# Patient Record
Sex: Male | Born: 1960 | Race: White | Hispanic: No | Marital: Single | Smoking: Former smoker
Health system: Southern US, Community
[De-identification: ages and names within clinical notes are randomized; demographics above are authoritative.]

## PROBLEM LIST (undated history)

## (undated) DIAGNOSIS — R7303 Prediabetes: Secondary | ICD-10-CM

## (undated) DIAGNOSIS — I639 Cerebral infarction, unspecified: Secondary | ICD-10-CM

## (undated) DIAGNOSIS — E039 Hypothyroidism, unspecified: Secondary | ICD-10-CM

## (undated) DIAGNOSIS — J302 Other seasonal allergic rhinitis: Secondary | ICD-10-CM

## (undated) DIAGNOSIS — G4730 Sleep apnea, unspecified: Secondary | ICD-10-CM

## (undated) DIAGNOSIS — I10 Essential (primary) hypertension: Secondary | ICD-10-CM

## (undated) DIAGNOSIS — Z87898 Personal history of other specified conditions: Secondary | ICD-10-CM

## (undated) DIAGNOSIS — R55 Syncope and collapse: Secondary | ICD-10-CM

## (undated) HISTORY — DX: Essential (primary) hypertension: I10

## (undated) HISTORY — DX: Other seasonal allergic rhinitis: J30.2

## (undated) HISTORY — DX: Personal history of other specified conditions: Z87.898

## (undated) HISTORY — DX: Syncope and collapse: R55

## (undated) HISTORY — DX: Cerebral infarction, unspecified: I63.9

## (undated) HISTORY — PX: NASAL SINUS SURGERY: SHX719

## (undated) HISTORY — PX: OTHER SURGICAL HISTORY: SHX169

---

## 2003-06-15 DIAGNOSIS — I639 Cerebral infarction, unspecified: Secondary | ICD-10-CM

## 2003-06-15 HISTORY — DX: Cerebral infarction, unspecified: I63.9

## 2005-02-24 ENCOUNTER — Emergency Department (HOSPITAL_COMMUNITY): Admission: EM | Admit: 2005-02-24 | Discharge: 2005-02-24 | Payer: Self-pay | Admitting: Emergency Medicine

## 2007-04-05 IMAGING — CR DG CHEST 1V PORT
1 series · 1 of 1 positions shown · non-contrast
Comparison: None available.

CLINICAL DATA: Dizziness and chest pain.  
 PORTABLE CHEST - 1 VIEW 02/24/05:

[view not recorded]
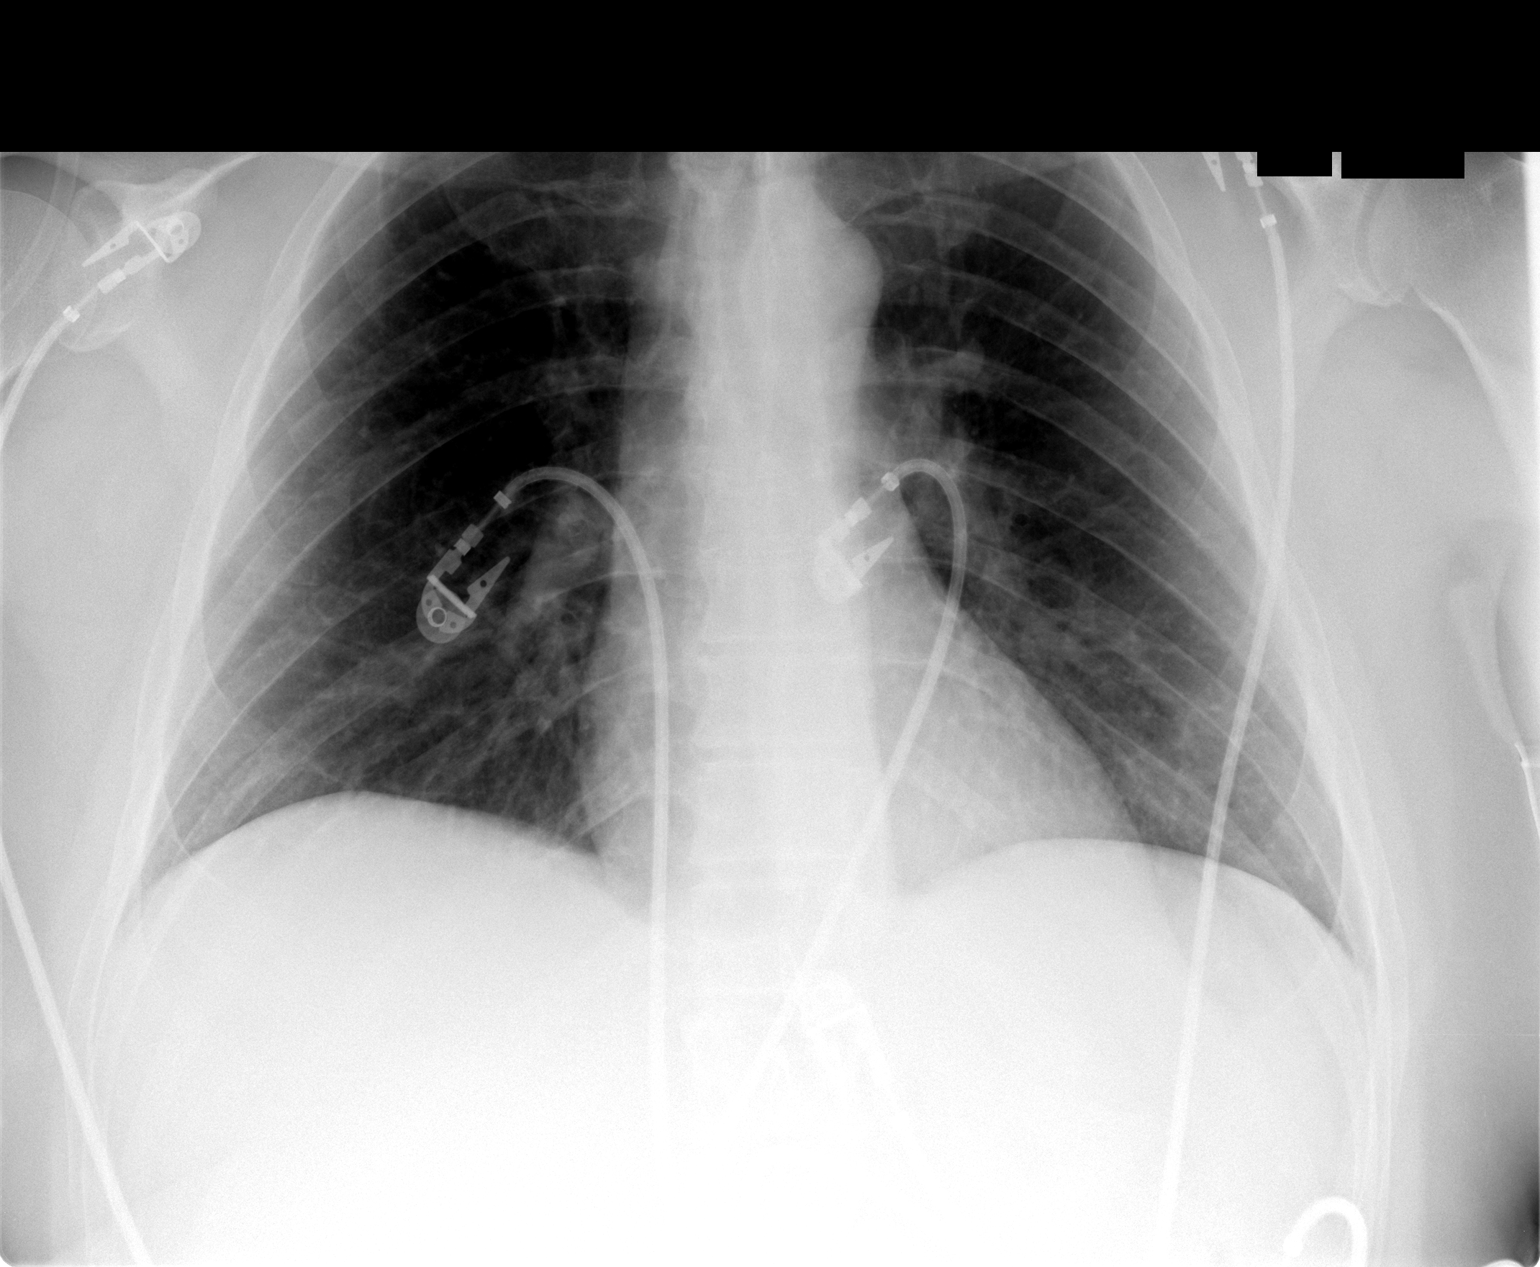

[1 of 1 positions shown; findings below may reference images not displayed]

FINDINGS: Trachea is midline.  Heart size within normal limits.  Lungs are clear.
IMPRESSION: No acute cardiopulmonary process.

## 2011-08-27 ENCOUNTER — Encounter: Payer: Self-pay | Admitting: *Deleted

## 2011-08-27 DIAGNOSIS — R55 Syncope and collapse: Secondary | ICD-10-CM | POA: Insufficient documentation

## 2011-08-27 DIAGNOSIS — Z87898 Personal history of other specified conditions: Secondary | ICD-10-CM | POA: Insufficient documentation

## 2011-08-27 DIAGNOSIS — I639 Cerebral infarction, unspecified: Secondary | ICD-10-CM | POA: Insufficient documentation

## 2011-08-27 DIAGNOSIS — J302 Other seasonal allergic rhinitis: Secondary | ICD-10-CM | POA: Insufficient documentation

## 2011-08-27 DIAGNOSIS — K5900 Constipation, unspecified: Secondary | ICD-10-CM | POA: Insufficient documentation

## 2011-11-17 ENCOUNTER — Encounter: Payer: Self-pay | Admitting: Cardiovascular Disease

## 2016-03-18 ENCOUNTER — Other Ambulatory Visit: Payer: Self-pay | Admitting: Orthopedic Surgery

## 2016-04-15 ENCOUNTER — Encounter (HOSPITAL_BASED_OUTPATIENT_CLINIC_OR_DEPARTMENT_OTHER): Payer: Self-pay | Admitting: *Deleted

## 2016-04-15 NOTE — Progress Notes (Signed)
Pt's pmh and cardiology notes reviewed with Dr Desmond Lopeurk MDA. Will get EKG dos. Ok [facility] 04/22/16.

## 2016-04-22 ENCOUNTER — Ambulatory Visit (HOSPITAL_BASED_OUTPATIENT_CLINIC_OR_DEPARTMENT_OTHER): Payer: Self-pay | Admitting: Anesthesiology

## 2016-04-22 ENCOUNTER — Encounter (HOSPITAL_BASED_OUTPATIENT_CLINIC_OR_DEPARTMENT_OTHER): Payer: Self-pay | Admitting: Anesthesiology

## 2016-04-22 ENCOUNTER — Encounter (HOSPITAL_BASED_OUTPATIENT_CLINIC_OR_DEPARTMENT_OTHER)
Admission: RE | Disposition: A | Discharge status: Home / Self Care | Payer: Self-pay | Source: Ambulatory Visit | Attending: Orthopedic Surgery

## 2016-04-22 ENCOUNTER — Ambulatory Visit (HOSPITAL_BASED_OUTPATIENT_CLINIC_OR_DEPARTMENT_OTHER)
Admission: RE | Admit: 2016-04-22 | Discharge: 2016-04-22 | Disposition: A | Discharge status: Home / Self Care | Payer: Self-pay | Source: Ambulatory Visit | Attending: Orthopedic Surgery | Admitting: Orthopedic Surgery

## 2016-04-22 DIAGNOSIS — Z8673 Personal history of transient ischemic attack (TIA), and cerebral infarction without residual deficits: Secondary | ICD-10-CM | POA: Insufficient documentation

## 2016-04-22 DIAGNOSIS — G4730 Sleep apnea, unspecified: Secondary | ICD-10-CM | POA: Insufficient documentation

## 2016-04-22 DIAGNOSIS — M180 Bilateral primary osteoarthritis of first carpometacarpal joints: Secondary | ICD-10-CM | POA: Insufficient documentation

## 2016-04-22 DIAGNOSIS — I10 Essential (primary) hypertension: Secondary | ICD-10-CM | POA: Insufficient documentation

## 2016-04-22 DIAGNOSIS — Q740 Other congenital malformations of upper limb(s), including shoulder girdle: Secondary | ICD-10-CM | POA: Insufficient documentation

## 2016-04-22 DIAGNOSIS — Z87891 Personal history of nicotine dependence: Secondary | ICD-10-CM | POA: Insufficient documentation

## 2016-04-22 DIAGNOSIS — E119 Type 2 diabetes mellitus without complications: Secondary | ICD-10-CM | POA: Insufficient documentation

## 2016-04-22 HISTORY — PX: EXCISION METACARPAL MASS: SHX6372

## 2016-04-22 HISTORY — DX: Prediabetes: R73.03

## 2016-04-22 HISTORY — PX: STERIOD INJECTION: SHX5046

## 2016-04-22 HISTORY — DX: Hypothyroidism, unspecified: E03.9

## 2016-04-22 HISTORY — PX: CARPOMETACARPEL SUSPENSION PLASTY: SHX5005

## 2016-04-22 HISTORY — DX: Sleep apnea, unspecified: G47.30

## 2016-04-22 LAB — POCT I-STAT, CHEM 8
BUN: 11 mg/dL (ref 6–20)
CHLORIDE: 104 mmol/L (ref 101–111)
Calcium, Ion: 1.19 mmol/L (ref 1.15–1.40)
Creatinine, Ser: 0.7 mg/dL (ref 0.61–1.24)
Glucose, Bld: 113 mg/dL — ABNORMAL HIGH (ref 65–99)
HEMATOCRIT: 48 % (ref 39.0–52.0)
Hemoglobin: 16.3 g/dL (ref 13.0–17.0)
Potassium: 3.6 mmol/L (ref 3.5–5.1)
SODIUM: 141 mmol/L (ref 135–145)
TCO2: 24 mmol/L (ref 0–100)

## 2016-04-22 SURGERY — CARPOMETACARPEL (CMC) SUSPENSION PLASTY
Anesthesia: Monitor Anesthesia Care | Site: Hand | Laterality: Right

## 2016-04-22 MED ORDER — BETAMETHASONE SOD PHOS & ACET 6 (3-3) MG/ML IJ SUSP
INTRAMUSCULAR | Status: DC | PRN
  Administered 2016-04-22: .5 mL via INTRA_ARTICULAR

## 2016-04-22 MED ORDER — LIDOCAINE HCL (CARDIAC) 20 MG/ML IV SOLN
INTRAVENOUS | Status: DC | PRN
  Administered 2016-04-22: 10 mg via INTRAVENOUS

## 2016-04-22 MED ORDER — PROMETHAZINE HCL 25 MG/ML IJ SOLN: 6.2500 mg | INTRAMUSCULAR | Status: DC | PRN

## 2016-04-22 MED ORDER — LIDOCAINE-EPINEPHRINE (PF) 2 %-1:200000 IJ SOLN
INTRAMUSCULAR | Status: DC | PRN
  Administered 2016-04-22: 10 mL via INTRADERMAL

## 2016-04-22 MED ORDER — DEXAMETHASONE SODIUM PHOSPHATE 10 MG/ML IJ SOLN
INTRAMUSCULAR | Status: AC
  Filled 2016-04-22: qty 1

## 2016-04-22 MED ORDER — FENTANYL CITRATE (PF) 100 MCG/2ML IJ SOLN
INTRAMUSCULAR | Status: AC
  Filled 2016-04-22: qty 2

## 2016-04-22 MED ORDER — SCOPOLAMINE 1 MG/3DAYS TD PT72: 1.0000 | MEDICATED_PATCH | Freq: Once | TRANSDERMAL | Status: DC | PRN

## 2016-04-22 MED ORDER — OXYCODONE HCL 5 MG PO TABS: 5.0000 mg | ORAL_TABLET | Freq: Once | ORAL | Status: DC | PRN

## 2016-04-22 MED ORDER — FENTANYL CITRATE (PF) 100 MCG/2ML IJ SOLN
INTRAMUSCULAR | Status: DC | PRN
  Administered 2016-04-22: 100 ug via INTRAVENOUS

## 2016-04-22 MED ORDER — MIDAZOLAM HCL 2 MG/2ML IJ SOLN
INTRAMUSCULAR | Status: AC
  Filled 2016-04-22: qty 2

## 2016-04-22 MED ORDER — MIDAZOLAM HCL 2 MG/2ML IJ SOLN
1.0000 mg | INTRAMUSCULAR | Status: DC | PRN
  Administered 2016-04-22: 2 mg via INTRAVENOUS

## 2016-04-22 MED ORDER — OXYCODONE HCL 5 MG/5ML PO SOLN: 5.0000 mg | Freq: Once | ORAL | Status: DC | PRN

## 2016-04-22 MED ORDER — CHLORHEXIDINE GLUCONATE 4 % EX LIQD: 60.0000 mL | Freq: Once | CUTANEOUS | Status: DC

## 2016-04-22 MED ORDER — BUPIVACAINE-EPINEPHRINE (PF) 0.5% -1:200000 IJ SOLN
INTRAMUSCULAR | Status: DC | PRN
  Administered 2016-04-22: 30 mL via PERINEURAL
  Administered 2016-04-22: 10 mL

## 2016-04-22 MED ORDER — MEPERIDINE HCL 25 MG/ML IJ SOLN: 6.2500 mg | INTRAMUSCULAR | Status: DC | PRN

## 2016-04-22 MED ORDER — BETAMETHASONE SOD PHOS & ACET 6 (3-3) MG/ML IJ SUSP
INTRAMUSCULAR | Status: AC
  Filled 2016-04-22: qty 1

## 2016-04-22 MED ORDER — CEFAZOLIN SODIUM-DEXTROSE 2-4 GM/100ML-% IV SOLN
2.0000 g | INTRAVENOUS | Status: AC
  Administered 2016-04-22: 2 g via INTRAVENOUS

## 2016-04-22 MED ORDER — CEFAZOLIN SODIUM-DEXTROSE 2-4 GM/100ML-% IV SOLN
INTRAVENOUS | Status: AC
  Filled 2016-04-22: qty 100

## 2016-04-22 MED ORDER — PROPOFOL 10 MG/ML IV BOLUS
INTRAVENOUS | Status: DC | PRN
  Administered 2016-04-22 (×4): 20 mg via INTRAVENOUS

## 2016-04-22 MED ORDER — ONDANSETRON HCL 4 MG/2ML IJ SOLN
INTRAMUSCULAR | Status: DC | PRN
  Administered 2016-04-22: 4 mg via INTRAVENOUS

## 2016-04-22 MED ORDER — MIDAZOLAM HCL 5 MG/5ML IJ SOLN
INTRAMUSCULAR | Status: DC | PRN
  Administered 2016-04-22: 2 mg via INTRAVENOUS

## 2016-04-22 MED ORDER — ONDANSETRON HCL 4 MG/2ML IJ SOLN
INTRAMUSCULAR | Status: AC
  Filled 2016-04-22: qty 2

## 2016-04-22 MED ORDER — LIDOCAINE 2% (20 MG/ML) 5 ML SYRINGE
INTRAMUSCULAR | Status: AC
  Filled 2016-04-22: qty 5

## 2016-04-22 MED ORDER — LACTATED RINGERS IV SOLN
INTRAVENOUS | Status: DC
  Administered 2016-04-22: 11:00:00 via INTRAVENOUS
  Administered 2016-04-22: 10 mL/h via INTRAVENOUS

## 2016-04-22 MED ORDER — PROPOFOL 10 MG/ML IV BOLUS
INTRAVENOUS | Status: AC
  Filled 2016-04-22: qty 20

## 2016-04-22 MED ORDER — FENTANYL CITRATE (PF) 100 MCG/2ML IJ SOLN
50.0000 ug | INTRAMUSCULAR | Status: DC | PRN
Start: 2016-04-22 — End: 2016-04-22
  Administered 2016-04-22: 50 ug via INTRAVENOUS

## 2016-04-22 MED ORDER — HYDROMORPHONE HCL 1 MG/ML IJ SOLN: 0.2500 mg | INTRAMUSCULAR | Status: DC | PRN

## 2016-04-22 MED ORDER — OXYCODONE-ACETAMINOPHEN 7.5-325 MG PO TABS: 1.0000 | ORAL_TABLET | ORAL | 0 refills | Status: DC | PRN

## 2016-04-22 SURGICAL SUPPLY — 70 items
BLADE ARTHRO LOK 4 BEAVER (BLADE) IMPLANT
BLADE ARTHRO LOK 4MM BEAVER (BLADE)
BLADE MINI RND TIP GREEN BEAV (BLADE) ×4 IMPLANT
BLADE SURG 15 STRL LF DISP TIS (BLADE) ×2 IMPLANT
BLADE SURG 15 STRL SS (BLADE) ×4
BNDG CMPR 9X4 STRL LF SNTH (GAUZE/BANDAGES/DRESSINGS) ×2
BNDG COHESIVE 3X5 TAN STRL LF (GAUZE/BANDAGES/DRESSINGS) ×4 IMPLANT
BNDG ESMARK 4X9 LF (GAUZE/BANDAGES/DRESSINGS) ×4 IMPLANT
BNDG GAUZE ELAST 4 BULKY (GAUZE/BANDAGES/DRESSINGS) ×4 IMPLANT
CHLORAPREP W/TINT 26ML (MISCELLANEOUS) ×4 IMPLANT
CORDS BIPOLAR (ELECTRODE) ×4 IMPLANT
COVER BACK TABLE 60X90IN (DRAPES) ×4 IMPLANT
COVER MAYO STAND STRL (DRAPES) ×4 IMPLANT
CUFF TOURNIQUET SINGLE 18IN (TOURNIQUET CUFF) ×2 IMPLANT
DECANTER SPIKE VIAL GLASS SM (MISCELLANEOUS) IMPLANT
DRAPE EXTREMITY T 121X128X90 (DRAPE) ×4 IMPLANT
DRAPE OEC MINIVIEW 54X84 (DRAPES) ×4 IMPLANT
DRAPE SURG 17X23 STRL (DRAPES) ×4 IMPLANT
GAUZE SPONGE 4X4 12PLY STRL (GAUZE/BANDAGES/DRESSINGS) ×4 IMPLANT
GAUZE SPONGE 4X4 16PLY XRAY LF (GAUZE/BANDAGES/DRESSINGS) IMPLANT
GAUZE XEROFORM 1X8 LF (GAUZE/BANDAGES/DRESSINGS) ×4 IMPLANT
GLOVE BIO SURGEON STRL SZ 6 (GLOVE) ×2 IMPLANT
GLOVE BIO SURGEON STRL SZ 6.5 (GLOVE) ×1 IMPLANT
GLOVE BIO SURGEON STRL SZ7.5 (GLOVE) ×2 IMPLANT
GLOVE BIO SURGEONS STRL SZ 6.5 (GLOVE) ×1
GLOVE BIOGEL PI IND STRL 7.0 (GLOVE) IMPLANT
GLOVE BIOGEL PI IND STRL 8 (GLOVE) IMPLANT
GLOVE BIOGEL PI IND STRL 8.5 (GLOVE) ×2 IMPLANT
GLOVE BIOGEL PI INDICATOR 7.0 (GLOVE) ×2
GLOVE BIOGEL PI INDICATOR 8 (GLOVE) ×2
GLOVE BIOGEL PI INDICATOR 8.5 (GLOVE) ×2
GLOVE SURG ORTHO 8.0 STRL STRW (GLOVE) ×4 IMPLANT
GOWN STRL REUS W/ TWL LRG LVL3 (GOWN DISPOSABLE) ×2 IMPLANT
GOWN STRL REUS W/TWL LRG LVL3 (GOWN DISPOSABLE) ×4
GOWN STRL REUS W/TWL XL LVL3 (GOWN DISPOSABLE) ×6 IMPLANT
NDL PRECISIONGLIDE 27X1.5 (NEEDLE) IMPLANT
NDL SAFETY ECLIPSE 18X1.5 (NEEDLE) ×2 IMPLANT
NEEDLE HYPO 18GX1.5 SHARP (NEEDLE) ×12
NEEDLE PRECISIONGLIDE 27X1.5 (NEEDLE) ×4 IMPLANT
NS IRRIG 1000ML POUR BTL (IV SOLUTION) ×4 IMPLANT
PACK BASIN DAY SURGERY FS (CUSTOM PROCEDURE TRAY) ×4 IMPLANT
PAD CAST 3X4 CTTN HI CHSV (CAST SUPPLIES) ×2 IMPLANT
PADDING CAST ABS 3INX4YD NS (CAST SUPPLIES)
PADDING CAST ABS 4INX4YD NS (CAST SUPPLIES) ×2
PADDING CAST ABS COTTON 3X4 (CAST SUPPLIES) IMPLANT
PADDING CAST ABS COTTON 4X4 ST (CAST SUPPLIES) ×2 IMPLANT
PADDING CAST COTTON 3X4 STRL (CAST SUPPLIES) ×4
RUBBERBAND STERILE (MISCELLANEOUS) IMPLANT
SLEEVE SCD COMPRESS KNEE MED (MISCELLANEOUS) ×4 IMPLANT
SPLINT PLASTER CAST XFAST 3X15 (CAST SUPPLIES) IMPLANT
SPLINT PLASTER XTRA FASTSET 3X (CAST SUPPLIES) ×20
STOCKINETTE 4X48 STRL (DRAPES) ×4 IMPLANT
SUT ETHIBOND 2 OS 4 DA (SUTURE) IMPLANT
SUT ETHIBOND 3-0 V-5 (SUTURE) IMPLANT
SUT ETHILON 4 0 PS 2 18 (SUTURE) ×8 IMPLANT
SUT FIBERWIRE 2-0 18 17.9 3/8 (SUTURE)
SUT FIBERWIRE 4-0 18 DIAM BLUE (SUTURE) ×4
SUT MERSILENE 4 0 P 3 (SUTURE) IMPLANT
SUT STEEL 3 0 (SUTURE) ×4 IMPLANT
SUT VIC AB 4-0 P-3 18XBRD (SUTURE) IMPLANT
SUT VIC AB 4-0 P2 18 (SUTURE) IMPLANT
SUT VIC AB 4-0 P3 18 (SUTURE)
SUTURE FIBERWR 2-0 18 17.9 3/8 (SUTURE) IMPLANT
SUTURE FIBERWR 4-0 18 DIA BLUE (SUTURE) ×2 IMPLANT
SYR 5ML LUER SLIP (SYRINGE) ×4 IMPLANT
SYR BULB 3OZ (MISCELLANEOUS) ×4 IMPLANT
SYR CONTROL 10ML LL (SYRINGE) ×2 IMPLANT
TOWEL OR 17X24 6PK STRL BLUE (TOWEL DISPOSABLE) ×6 IMPLANT
TOWEL OR NON WOVEN STRL DISP B (DISPOSABLE) ×4 IMPLANT
UNDERPAD 30X30 (UNDERPADS AND DIAPERS) ×4 IMPLANT

## 2016-04-22 NOTE — Transfer of Care (Signed)
Immediate Anesthesia Transfer of Care Note  Patient: [name]  Procedure(s) Performed: Procedure(s): LEFT SUSPENSION PLASTY LEFT/ABDUCTOR POLLICIS LONGUS TRANSFER INJECT RIGHT THUMB CARPOMETACARPEL (CMC) JOINT (Left) TRAPEZIUM EXCISION (Left) STEROID INJECTION (Right)  Patient Location: PACU  Anesthesia Type:Regional  Level of Consciousness: awake and patient cooperative  Airway & Oxygen Therapy: Patient Spontanous Breathing and Patient connected to face mask oxygen  Post-op Assessment: Report given to RN and Post -op Vital signs reviewed and stable  Post vital signs: Reviewed and stable  Last Vitals:  Vitals:   04/22/16 0855 04/22/16 0900  BP:  (!) 148/94  Pulse: 69 77  Resp: (!) 21 18  Temp:      Last Pain:  Vitals:   04/22/16 0746  TempSrc: Oral         Complications: No apparent anesthesia complications

## 2016-04-22 NOTE — Brief Op Note (Signed)
04/22/2016  11:39 AM  PATIENT:  [name]  55 y.o. male  PRE-OPERATIVE DIAGNOSIS:  Carpometacarpal Arthritis  Bilateral Thumbs  POST-OPERATIVE DIAGNOSIS:  Carpometacarpal Arthritis  Bilateral Thumbs  PROCEDURE:  Procedure(s): LEFT SUSPENSION PLASTY LEFT/ABDUCTOR POLLICIS LONGUS TRANSFER INJECT RIGHT THUMB CARPOMETACARPEL (CMC) JOINT (Left) TRAPEZIUM EXCISION (Left) STEROID INJECTION (Right)  SURGEON:  Surgeon(s) and Role:    * Cindee SaltGary Debroah Shuttleworth, MD - Primary    * Betha LoaKevin Kamrie Fanton, MD - Assisting  PHYSICIAN ASSISTANT:   ASSISTANTS: Karlyn AgeeK Ayshia Gramlich, MD   ANESTHESIA:   local and regional  EBL:  Total I/O In: 1000 [I.V.:1000] Out: -   BLOOD ADMINISTERED:none  DRAINS: none   LOCAL MEDICATIONS USED:  BUPIVICAINE   SPECIMEN:  No Specimen  DISPOSITION OF SPECIMEN:  N/A  COUNTS:  YES  TOURNIQUET:   Total Tourniquet Time Documented: Upper Arm (Left) - 61 minutes Total: Upper Arm (Left) - 61 minutes   DICTATION: .Other Dictation: Dictation Number [id number]  PLAN OF CARE: Discharge to home after PACU  PATIENT DISPOSITION:  PACU - hemodynamically stable.

## 2016-04-22 NOTE — Anesthesia Preprocedure Evaluation (Addendum)
Anesthesia Evaluation  Patient identified by MRN, date of birth, ID band Patient awake    Reviewed: Allergy & Precautions, NPO status , Patient's Chart, lab work & pertinent test results  Airway Mallampati: II  TM Distance: >3 FB Neck ROM: Full    Dental no notable dental hx.    Pulmonary sleep apnea , former smoker,    Pulmonary exam normal breath sounds clear to auscultation       Cardiovascular hypertension, Pt. on medications and Pt. on home beta blockers Normal cardiovascular exam Rhythm:Regular Rate:Normal     Neuro/Psych CVA negative psych ROS   GI/Hepatic negative GI ROS, Neg liver ROS,   Endo/Other    Renal/GU negative Renal ROS     Musculoskeletal negative musculoskeletal ROS (+)   Abdominal (+) + obese,   Peds negative pediatric ROS (+)  Hematology negative hematology ROS (+)   Anesthesia Other Findings   Reproductive/Obstetrics                            Anesthesia Physical Anesthesia Plan  ASA: III  Anesthesia Plan: Regional   Post-op Pain Management:    Induction:   Airway Management Planned:   Additional Equipment:   Intra-op Plan:   Post-operative Plan:   Informed Consent: I have reviewed the patients History and Physical, chart, labs and discussed the procedure including the risks, benefits and alternatives for the proposed anesthesia with the patient or authorized representative who has indicated his/her understanding and acceptance.   Dental advisory given  Plan Discussed with: CRNA  Anesthesia Plan Comments:        Anesthesia Quick Evaluation

## 2016-04-22 NOTE — Discharge Instructions (Addendum)

## 2016-04-22 NOTE — Anesthesia Postprocedure Evaluation (Signed)
Anesthesia Post Note  Patient: Norton PastelGrady N Dawood  Procedure(s) Performed: Procedure(s) (LRB): LEFT SUSPENSION PLASTY LEFT/ABDUCTOR POLLICIS LONGUS TRANSFER INJECT RIGHT THUMB CARPOMETACARPEL (CMC) JOINT (Left) TRAPEZIUM EXCISION (Left) STEROID INJECTION (Right)  Patient location during evaluation: PACU Anesthesia Type: MAC and Regional Level of consciousness: awake and alert Pain management: pain level controlled Vital Signs Assessment: post-procedure vital signs reviewed and stable Respiratory status: spontaneous breathing Cardiovascular status: stable Anesthetic complications: no    Last Vitals:  Vitals:   04/22/16 1215 04/22/16 1230  BP: 121/85 (!) 143/99  Pulse: 65 65  Resp: 13 16  Temp:  36.4 C    Last Pain:  Vitals:   04/22/16 1230  TempSrc:   PainSc: 0-No pain                 Lewie LoronJohn Baani Bober

## 2016-04-22 NOTE — Op Note (Signed)
Dictation Number [id number]

## 2016-04-22 NOTE — Anesthesia Procedure Notes (Signed)
Anesthesia Regional Block:  Axillary brachial plexus block  Pre-Anesthetic Checklist: ,, timeout performed, Correct Patient, Correct Site, Correct Laterality, Correct Procedure, Correct Position, site marked, Risks and benefits discussed,  Surgical consent,  Pre-op evaluation,  At surgeon's request and post-op pain management  Laterality: Left  Prep: chloraprep       Needles:  Injection technique: Single-shot  Needle Type: Stimulator Needle - 40     Needle Length: 4cm 4 cm Needle Gauge: 22 and 22 G    Additional Needles:  Procedures: nerve stimulator Axillary brachial plexus block Narrative:  Injection made incrementally with aspirations every 5 mL. Anesthesiologist: Lewie LoronGERMEROTH, Eluzer Howdeshell  Additional Notes: BP cuff, EKG monitors applied. Sedation begun. Nerve location verified with U/S. Anesthetic injected incrementally, slowly , and after neg aspirations under direct u/s guidance. Good perineural spread. Tolerated well.

## 2016-04-22 NOTE — Progress Notes (Signed)
Assisted Dr. Germeroth with left, ultrasound guided, axillary block. Side rails up, monitors on throughout procedure. See vital signs in flow sheet. Tolerated Procedure well. 

## 2016-04-22 NOTE — Op Note (Signed)
I assisted Surgeon(s) and Role:    * Cindee SaltGary Goro Wenrick, MD - Primary    * Betha LoaKevin Sole Lengacher, MD - Assisting on the Procedure(s): LEFT SUSPENSION PLASTY LEFT/ABDUCTOR POLLICIS LONGUS TRANSFER INJECT RIGHT THUMB CARPOMETACARPEL [facility](CMC) JOINT TRAPEZIUM EXCISION STEROID INJECTION on 04/22/2016.  I provided assistance on this case as follows: retraction of soft tissues, harvest and passing of tendon graft, closure of wounds.  Electronically signed by: Tami RibasKUZMA,Natalina Wieting R, MD Date: 04/22/2016 Time: 11:51 AM

## 2016-04-22 NOTE — H&P (Signed)
  [name] is an 55 y.o. male.   Chief Complaint:paincarpometacarpal joints both hands HPI: [name] is a 55 year old right-handed dominant male former patient of Dr. Teressa Cole. He comes in with a complaint of both thumbs basilar joints. Right equal to left. He has had multiple injections to both sides. He has a VAS score of 10/10. With a sharp pain with attempting even picking up a piece of paper P. He has been treated with splints anti-inflammatories tramadol multiple injections he states that he was scheduled for surgery at one point this was canceled due to his blood pressure. This was in approximately 2011. He has a history diabetes thyroid problems arthritis no history of gout. His family history is positive for arthritis negative for diabetes thyroid problems and gout. He is not complaining of any numbness or tingling       Past Medical History:  Diagnosis Date  . Constipation   . CVA (cerebral vascular accident) (HCC) 2005  . H/O dizziness   . Hypertension   . Hypothyroidism   . Pre-diabetes    had been on januvia, no lomger taking  . Pre-syncope   . Seasonal allergies   . Sleep apnea    [facility]    Past Surgical History:  Procedure Laterality Date  . NASAL SINUS SURGERY    . OTHER SURGICAL HISTORY     tonsilectomy age 89  . ruptured disc     x 4    Family History  Problem Relation Age of Onset  . Hypertension Brother   . Hypertension Brother    Social History:  reports that he has quit smoking. He has never used smokeless tobacco. He reports that he does not drink alcohol or use drugs.  Allergies:  Allergies  Allergen Reactions  . Lisinopril     cough  . Naprosyn [Naproxen]     itching    No prescriptions prior to admission.    No results found for this or any previous visit (from the past 48 hour(s)).  No results found.   Pertinent items are noted in HPI.  Height 5' 7.5" (1.715 m), weight 107.5 kg (237 lb).  General  appearance: alert, cooperative and appears stated age Head: Normocephalic, without obvious abnormality Neck: no JVD Resp: clear to auscultation bilaterally Cardio: regular rate and rhythm, S1, S2 normal, no murmur, click, rub or gallop GI: soft, non-tender; bowel sounds normal; no masses,  no organomegaly Extremities: pain basal joints both hands Pulses: 2+ and symmetric Skin: Skin color, texture, turgor normal. No rashes or lesions Neurologic: Grossly normal Incision/Wound: na  Assessment/Plan Assessment:   1. Bilateral thumb pain  2. 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 He is scheduled for suspension plasty left thumb. Pre peri and Postoperative course been discussed along with risks and complications. He is aware that there is no guarantee to the surgery the possibility of infection recurrence injury to arteries nerves tendons incomplete relief of symptoms and dystrophy. Gradual is an outpatient under regional anesthesia. He would like to have the right thumb CMC and STT injected at that time.      [name] R 04/22/2016, 4:57 AM

## 2016-04-23 ENCOUNTER — Encounter (HOSPITAL_BASED_OUTPATIENT_CLINIC_OR_DEPARTMENT_OTHER): Payer: Self-pay | Admitting: Orthopedic Surgery

## 2016-04-23 NOTE — Op Note (Signed)
NAMEarle Gell:  [place],                     ACCOUNT NO.:  [id number]  MEDICAL RECORD NO.:  [id number]  LOCATION:                                 FACILITY:  PHYSICIAN:  Cindee SaltGary Teniya Filter, M.D.            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  OPERATION:  Suspensionplasty with excision of trapezium; adductor pollicis longus tendon transfer, left thumb; injection of carpometacarpal and scaphotrapeziotrapezoidal joint, right thumb.  SURGEON:  Cindee SaltGary Van Seymore, M.D.  ASSISTANT:  Betha LoaKevin Savonna Birchmeier, M.D.  ANESTHESIA:  Supraclavicular block with sedation.  ANESTHESIOLOGIST:  Dr. Okey Dupreose.  HISTORY:  The patient is a 55 year old male with a history of bilateral CMC arthritis, pantrapezial in nature.  He has undergone conservative treatment, has elected to undergo surgical treatment for this.  Pre, peri, and postoperative course have been discussed along with risks and complications.  He is aware that there is no guarantee to the surgery; the possibility of infection; recurrence of injury to arteries, nerves, tendons; incomplete relief of symptoms; and dystrophy.  In the preoperative area, the patient was seen, the extremity marked by both patient and surgeon.  Antibiotic was given.  PROCEDURE IN DETAIL:  The patient was brought to the operating room where a sedation was given.  A supraclavicular block was carried out in the preoperative area.  He was prepped using ChloraPrep in a supine position with the left arm free.  A 3-minute dry time was allowed.  Time- out taken, confirming the patient and procedure.  The limb was exsanguinated with an Esmarch bandage.  Tourniquet placed high and the arm was inflated to 250 mmHg.  A curvilinear incision was made over  the STT area of the right thumb, slightly dorsal, carried down through subcutaneous tissue.  Bleeders were electrocauterized with bipolar. Radial nerve was identified and protected.  Radial artery was identified and protected.  An incision was then made identifying the [facility], STT joint.  The periosteum elevated with sharp dissection periosteal elevators.  The [facility] was opened.  The trapezium was isolated as much as possible.  This was then removed piecemeal using a rongeur.  The bone fragments were entirely removed.  A drill hole was then placed in the proximal portion of the metacarpal, enlarged after identifying the dorsal one-half of the abductor pollicis longus tendon inserting into bone.  A separate incision was then made proximally at the musculotendinous junction, carried down through the subcutaneous tissue. Radial nerve sensory branches protected as much as possible.  The dorsal half of the abductor pollicis longus tendon was then isolated.  A monofilament wire was then used as a cheese cutter to isolate the dorsal half without transecting at the musculotendinous junction.  A second drill hole was then placed in the base of the second metacarpal bringing this from a volar to dorsal ulnar direction.  This was enlarged with a larger drill bit.  This allowed passage of the abductor pollicis longus  tendon left attached to the base of the metacarpal.  This was passed from a dorsal to palmar direction through the thumb metacarpal base and then in a volar to dorsal direction through the index finger metacarpal base.  A hemostat was then used to deliver this to the defect created by excision in the trapezium using a long hemostat directly under the periosteum of the index metacarpal.  This was then passed around the abductor pollicis longus as it exited through the base of the thumb metacarpal and sutured with multiple figure-of-eight 4-0 FiberWire sutures.  This firmly suspended  the thumb from the index finger.  The more proximal incisions for placement of the graft and harvesting the graft were closed.  An incision was made over the base of the index metacarpal for the passage of the tendon back to the base of the thumb metacarpal.  The closure was done with interrupted 4-0 nylon sutures. The defect created by excision of the trapezium, periosteum was closed with figure-of-eight 4-0 Vicryl sutures.  The subcutaneous tissue was closed with interrupted 4-0 Vicryl, and skin with interrupted 4-0 nylon sutures.  X-rays taken AP, lateral with stress views that revealed no proximal migration of the thumb metacarpal.  The right thumb was then attended too after placement of a protective dressing thumb spica splint on the left side along with deflation of the tourniquet, which allowed immediate blood flow to the fingers and thumb.  Under image intensification, the CMC joint and STT joint were separately injected at the right thumb base.  The patient tolerated the procedure well.  He was taken to the recovery room for observation in satisfactory condition. He will be discharged to home [facility] [facility], on Percocet.  Assistance in this case was given by Betha LoaKevin Leala Bryand, M.D., throughout the entire procedure.          ______________________________ Cindee SaltGary Alec Jaros, M.D.     GK/MEDQ  D:  04/22/2016  T:  04/23/2016  Job:  [id number]

## 2017-03-01 ENCOUNTER — Other Ambulatory Visit: Payer: Self-pay | Admitting: Orthopedic Surgery

## 2017-03-31 ENCOUNTER — Encounter (HOSPITAL_BASED_OUTPATIENT_CLINIC_OR_DEPARTMENT_OTHER): Payer: Self-pay | Admitting: *Deleted

## 2017-04-06 NOTE — Anesthesia Preprocedure Evaluation (Addendum)
Anesthesia Evaluation  Patient identified by MRN, date of birth, ID band Patient awake    Reviewed: Allergy & Precautions, NPO status , Patient's Chart, lab work & pertinent test results  Airway Mallampati: II  TM Distance: >3 FB Neck ROM: Full    Dental no notable dental hx.    Pulmonary neg sleep apnea, former smoker,    Pulmonary exam normal breath sounds clear to auscultation       Cardiovascular hypertension, Pt. on medications and Pt. on home beta blockers Normal cardiovascular exam Rhythm:Regular Rate:Normal     Neuro/Psych CVA negative psych ROS   GI/Hepatic negative GI ROS, Neg liver ROS,   Endo/Other  Hypothyroidism   Renal/GU negative Renal ROS     Musculoskeletal negative musculoskeletal ROS (+)   Abdominal (+) + obese,   Peds negative pediatric ROS (+)  Hematology negative hematology ROS (+)   Anesthesia Other Findings   Reproductive/Obstetrics                            Anesthesia Physical  Anesthesia Plan  ASA: III  Anesthesia Plan: General   Post-op Pain Management: GA combined w/ Regional for post-op pain   Induction: Intravenous  PONV Risk Score and Plan: 1 and Ondansetron, Dexamethasone, Treatment may vary due to age or medical condition and Midazolam  Airway Management Planned: LMA  Additional Equipment:   Intra-op Plan:   Post-operative Plan:   Informed Consent: I have reviewed the patients History and Physical, chart, labs and discussed the procedure including the risks, benefits and alternatives for the proposed anesthesia with the patient or authorized representative who has indicated his/her understanding and acceptance.   Dental advisory given  Plan Discussed with: CRNA  Anesthesia Plan Comments: ( )        Anesthesia Quick Evaluation

## 2017-04-07 ENCOUNTER — Ambulatory Visit (HOSPITAL_BASED_OUTPATIENT_CLINIC_OR_DEPARTMENT_OTHER)
Admission: RE | Admit: 2017-04-07 | Discharge: 2017-04-07 | Disposition: A | Discharge status: Home / Self Care | Payer: Self-pay | Source: Ambulatory Visit | Attending: Orthopedic Surgery | Admitting: Orthopedic Surgery

## 2017-04-07 ENCOUNTER — Encounter (HOSPITAL_BASED_OUTPATIENT_CLINIC_OR_DEPARTMENT_OTHER)
Admission: RE | Disposition: A | Discharge status: Home / Self Care | Payer: Self-pay | Source: Ambulatory Visit | Attending: Orthopedic Surgery

## 2017-04-07 ENCOUNTER — Ambulatory Visit (HOSPITAL_BASED_OUTPATIENT_CLINIC_OR_DEPARTMENT_OTHER): Payer: Self-pay | Admitting: Anesthesiology

## 2017-04-07 ENCOUNTER — Encounter (HOSPITAL_BASED_OUTPATIENT_CLINIC_OR_DEPARTMENT_OTHER): Payer: Self-pay

## 2017-04-07 DIAGNOSIS — J302 Other seasonal allergic rhinitis: Secondary | ICD-10-CM | POA: Insufficient documentation

## 2017-04-07 DIAGNOSIS — M1811 Unilateral primary osteoarthritis of first carpometacarpal joint, right hand: Secondary | ICD-10-CM | POA: Insufficient documentation

## 2017-04-07 DIAGNOSIS — I10 Essential (primary) hypertension: Secondary | ICD-10-CM | POA: Insufficient documentation

## 2017-04-07 DIAGNOSIS — Z8673 Personal history of transient ischemic attack (TIA), and cerebral infarction without residual deficits: Secondary | ICD-10-CM | POA: Insufficient documentation

## 2017-04-07 DIAGNOSIS — Z87891 Personal history of nicotine dependence: Secondary | ICD-10-CM | POA: Insufficient documentation

## 2017-04-07 DIAGNOSIS — E039 Hypothyroidism, unspecified: Secondary | ICD-10-CM | POA: Insufficient documentation

## 2017-04-07 DIAGNOSIS — G4730 Sleep apnea, unspecified: Secondary | ICD-10-CM | POA: Insufficient documentation

## 2017-04-07 HISTORY — PX: CARPOMETACARPEL SUSPENSION PLASTY: SHX5005

## 2017-04-07 HISTORY — PX: FINGER ARTHROSCOPY WITH CARPOMETACARPEL (CMC) ARTHROPLASTY: SHX5629

## 2017-04-07 LAB — POCT I-STAT, CHEM 8
BUN: 17 mg/dL (ref 6–20)
CALCIUM ION: 1.2 mmol/L (ref 1.15–1.40)
CREATININE: 0.8 mg/dL (ref 0.61–1.24)
Chloride: 108 mmol/L (ref 101–111)
Glucose, Bld: 104 mg/dL — ABNORMAL HIGH (ref 65–99)
HCT: 44 % (ref 39.0–52.0)
Hemoglobin: 15 g/dL (ref 13.0–17.0)
Potassium: 3.3 mmol/L — ABNORMAL LOW (ref 3.5–5.1)
SODIUM: 143 mmol/L (ref 135–145)
TCO2: 22 mmol/L (ref 22–32)

## 2017-04-07 SURGERY — CARPOMETACARPEL (CMC) SUSPENSION PLASTY
Anesthesia: General | Site: Thumb | Laterality: Right

## 2017-04-07 MED ORDER — MIDAZOLAM HCL 2 MG/2ML IJ SOLN
INTRAMUSCULAR | Status: AC
  Filled 2017-04-07: qty 2

## 2017-04-07 MED ORDER — PROPOFOL 10 MG/ML IV BOLUS
INTRAVENOUS | Status: DC | PRN
  Administered 2017-04-07: 200 mg via INTRAVENOUS

## 2017-04-07 MED ORDER — CHLORHEXIDINE GLUCONATE 4 % EX LIQD: 60.0000 mL | Freq: Once | CUTANEOUS | Status: DC

## 2017-04-07 MED ORDER — DEXAMETHASONE SODIUM PHOSPHATE 10 MG/ML IJ SOLN
INTRAMUSCULAR | Status: AC
  Filled 2017-04-07: qty 1

## 2017-04-07 MED ORDER — LIDOCAINE 2% (20 MG/ML) 5 ML SYRINGE
INTRAMUSCULAR | Status: AC
  Filled 2017-04-07: qty 5

## 2017-04-07 MED ORDER — FENTANYL CITRATE (PF) 100 MCG/2ML IJ SOLN: 25.0000 ug | INTRAMUSCULAR | Status: DC | PRN

## 2017-04-07 MED ORDER — DEXAMETHASONE SODIUM PHOSPHATE 4 MG/ML IJ SOLN
INTRAMUSCULAR | Status: DC | PRN
  Administered 2017-04-07: 10 mg via INTRAVENOUS

## 2017-04-07 MED ORDER — LACTATED RINGERS IV SOLN
INTRAVENOUS | Status: DC
  Administered 2017-04-07 (×2): via INTRAVENOUS

## 2017-04-07 MED ORDER — FENTANYL CITRATE (PF) 100 MCG/2ML IJ SOLN
50.0000 ug | INTRAMUSCULAR | Status: DC | PRN
  Administered 2017-04-07: 100 ug via INTRAVENOUS

## 2017-04-07 MED ORDER — LIDOCAINE HCL (CARDIAC) 20 MG/ML IV SOLN
INTRAVENOUS | Status: DC | PRN
  Administered 2017-04-07: 30 mg via INTRAVENOUS

## 2017-04-07 MED ORDER — OXYCODONE-ACETAMINOPHEN 10-325 MG PO TABS: 1.0000 | ORAL_TABLET | Freq: Four times a day (QID) | ORAL | 0 refills | Status: AC | PRN

## 2017-04-07 MED ORDER — ONDANSETRON HCL 4 MG/2ML IJ SOLN: 4.0000 mg | Freq: Once | INTRAMUSCULAR | Status: DC | PRN

## 2017-04-07 MED ORDER — FENTANYL CITRATE (PF) 100 MCG/2ML IJ SOLN
INTRAMUSCULAR | Status: AC
  Filled 2017-04-07: qty 2

## 2017-04-07 MED ORDER — MEPERIDINE HCL 25 MG/ML IJ SOLN: 6.2500 mg | INTRAMUSCULAR | Status: DC | PRN

## 2017-04-07 MED ORDER — ROPIVACAINE HCL 7.5 MG/ML IJ SOLN
INTRAMUSCULAR | Status: DC | PRN
  Administered 2017-04-07: 25 mL via PERINEURAL

## 2017-04-07 MED ORDER — ONDANSETRON HCL 4 MG/2ML IJ SOLN
INTRAMUSCULAR | Status: AC
  Filled 2017-04-07: qty 2

## 2017-04-07 MED ORDER — MIDAZOLAM HCL 5 MG/5ML IJ SOLN
INTRAMUSCULAR | Status: DC | PRN
  Administered 2017-04-07: 2 mg via INTRAVENOUS

## 2017-04-07 MED ORDER — ONDANSETRON HCL 4 MG/2ML IJ SOLN
INTRAMUSCULAR | Status: DC | PRN
  Administered 2017-04-07: 4 mg via INTRAVENOUS

## 2017-04-07 MED ORDER — PROPOFOL 10 MG/ML IV BOLUS
INTRAVENOUS | Status: AC
  Filled 2017-04-07: qty 20

## 2017-04-07 MED ORDER — CEFAZOLIN SODIUM-DEXTROSE 2-4 GM/100ML-% IV SOLN
INTRAVENOUS | Status: AC
  Filled 2017-04-07: qty 100

## 2017-04-07 MED ORDER — HYDRALAZINE HCL 20 MG/ML IJ SOLN
10.0000 mg | Freq: Four times a day (QID) | INTRAMUSCULAR | Status: DC | PRN
  Administered 2017-04-07: 10 mg via INTRAVENOUS

## 2017-04-07 MED ORDER — SCOPOLAMINE 1 MG/3DAYS TD PT72: 1.0000 | MEDICATED_PATCH | Freq: Once | TRANSDERMAL | Status: DC | PRN

## 2017-04-07 MED ORDER — CEFAZOLIN SODIUM-DEXTROSE 2-4 GM/100ML-% IV SOLN
2.0000 g | INTRAVENOUS | Status: AC
  Administered 2017-04-07: 2 g via INTRAVENOUS

## 2017-04-07 MED ORDER — HYDRALAZINE HCL 20 MG/ML IJ SOLN
INTRAMUSCULAR | Status: AC
  Filled 2017-04-07: qty 1

## 2017-04-07 MED ORDER — MIDAZOLAM HCL 2 MG/2ML IJ SOLN
1.0000 mg | INTRAMUSCULAR | Status: DC | PRN
  Administered 2017-04-07: 2 mg via INTRAVENOUS

## 2017-04-07 SURGICAL SUPPLY — 72 items
BIT DRILL 7/64X5 DISP (BIT) ×1 IMPLANT
BLADE EAR TYMPAN 2.5 60D BEAV (BLADE) IMPLANT
BLADE MINI RND TIP GREEN BEAV (BLADE) ×2 IMPLANT
BLADE SURG 15 STRL LF DISP TIS (BLADE) ×1 IMPLANT
BLADE SURG 15 STRL SS (BLADE) ×2
BNDG CMPR 9X4 STRL LF SNTH (GAUZE/BANDAGES/DRESSINGS) ×1
BNDG COHESIVE 3X5 TAN STRL LF (GAUZE/BANDAGES/DRESSINGS) ×2 IMPLANT
BNDG ESMARK 4X9 LF (GAUZE/BANDAGES/DRESSINGS) ×2 IMPLANT
BNDG GAUZE ELAST 4 BULKY (GAUZE/BANDAGES/DRESSINGS) ×2 IMPLANT
CANISTER SUCT 1200ML W/VALVE (MISCELLANEOUS) IMPLANT
CHLORAPREP W/TINT 26ML (MISCELLANEOUS) ×2 IMPLANT
CORD BIPOLAR FORCEPS 12FT (ELECTRODE) ×2 IMPLANT
COVER BACK TABLE 60X90IN (DRAPES) ×2 IMPLANT
COVER MAYO STAND STRL (DRAPES) ×2 IMPLANT
CUFF TOURNIQUET SINGLE 18IN (TOURNIQUET CUFF) ×2 IMPLANT
DECANTER SPIKE VIAL GLASS SM (MISCELLANEOUS) IMPLANT
DRAPE EXTREMITY T 121X128X90 (DRAPE) ×2 IMPLANT
DRAPE OEC MINIVIEW 54X84 (DRAPES) ×2 IMPLANT
DRAPE SURG 17X23 STRL (DRAPES) ×2 IMPLANT
DRILL BIT 1/8DIAX5INL DISPOSE (BIT) ×1 IMPLANT
GAUZE SPONGE 4X4 12PLY STRL (GAUZE/BANDAGES/DRESSINGS) ×2 IMPLANT
GAUZE XEROFORM 1X8 LF (GAUZE/BANDAGES/DRESSINGS) ×2 IMPLANT
GLOVE BIO SURGEON STRL SZ7.5 (GLOVE) ×1 IMPLANT
GLOVE BIOGEL PI IND STRL 6.5 (GLOVE) IMPLANT
GLOVE BIOGEL PI IND STRL 7.0 (GLOVE) IMPLANT
GLOVE BIOGEL PI IND STRL 8 (GLOVE) IMPLANT
GLOVE BIOGEL PI IND STRL 8.5 (GLOVE) ×1 IMPLANT
GLOVE BIOGEL PI INDICATOR 6.5 (GLOVE) ×1
GLOVE BIOGEL PI INDICATOR 7.0 (GLOVE) ×1
GLOVE BIOGEL PI INDICATOR 8 (GLOVE) ×1
GLOVE BIOGEL PI INDICATOR 8.5 (GLOVE) ×1
GLOVE ECLIPSE 6.5 STRL STRAW (GLOVE) ×1 IMPLANT
GLOVE SURG ORTHO 8.0 STRL STRW (GLOVE) ×2 IMPLANT
GOWN STRL REUS W/ TWL LRG LVL3 (GOWN DISPOSABLE) ×1 IMPLANT
GOWN STRL REUS W/TWL LRG LVL3 (GOWN DISPOSABLE) ×2
GOWN STRL REUS W/TWL XL LVL3 (GOWN DISPOSABLE) ×3 IMPLANT
NDL PRECISIONGLIDE 27X1.5 (NEEDLE) IMPLANT
NDL SAFETY ECLIPSE 18X1.5 (NEEDLE) ×1 IMPLANT
NEEDLE HYPO 18GX1.5 SHARP (NEEDLE)
NEEDLE HYPO 22GX1.5 SAFETY (NEEDLE) ×1 IMPLANT
NEEDLE PRECISIONGLIDE 27X1.5 (NEEDLE) IMPLANT
NS IRRIG 1000ML POUR BTL (IV SOLUTION) ×2 IMPLANT
PACK BASIN DAY SURGERY FS (CUSTOM PROCEDURE TRAY) ×2 IMPLANT
PAD CAST 3X4 CTTN HI CHSV (CAST SUPPLIES) ×1 IMPLANT
PADDING CAST ABS 3INX4YD NS (CAST SUPPLIES) ×1
PADDING CAST ABS 4INX4YD NS (CAST SUPPLIES) ×1
PADDING CAST ABS COTTON 3X4 (CAST SUPPLIES) ×1 IMPLANT
PADDING CAST ABS COTTON 4X4 ST (CAST SUPPLIES) ×1 IMPLANT
PADDING CAST COTTON 3X4 STRL (CAST SUPPLIES) ×2
SLEEVE SCD COMPRESS KNEE MED (MISCELLANEOUS) ×2 IMPLANT
SPLINT PLASTER CAST XFAST 3X15 (CAST SUPPLIES) IMPLANT
SPLINT PLASTER XTRA FASTSET 3X (CAST SUPPLIES)
STOCKINETTE 4X48 STRL (DRAPES) ×2 IMPLANT
SUT ETHIBOND 3-0 V-5 (SUTURE) IMPLANT
SUT ETHILON 4 0 PS 2 18 (SUTURE) ×3 IMPLANT
SUT FIBERWIRE 4-0 18 DIAM BLUE (SUTURE) ×2
SUT MERSILENE 4 0 P 3 (SUTURE) IMPLANT
SUT PDS AB 2-0 CT2 27 (SUTURE) IMPLANT
SUT STEEL 3 0 (SUTURE) ×2 IMPLANT
SUT STEEL 4 0 (SUTURE) IMPLANT
SUT VIC AB 2-0 PS2 27 (SUTURE) IMPLANT
SUT VIC AB 4-0 P-3 18XBRD (SUTURE) IMPLANT
SUT VIC AB 4-0 P2 18 (SUTURE) IMPLANT
SUT VIC AB 4-0 P3 18 (SUTURE)
SUT VICRYL 4-0 PS2 18IN ABS (SUTURE) IMPLANT
SUTURE FIBERWR 4-0 18 DIA BLUE (SUTURE) ×1 IMPLANT
SYR BULB 3OZ (MISCELLANEOUS) ×2 IMPLANT
SYR CONTROL 10ML LL (SYRINGE) ×1 IMPLANT
TOWEL OR 17X24 6PK STRL BLUE (TOWEL DISPOSABLE) ×4 IMPLANT
TOWEL OR NON WOVEN STRL DISP B (DISPOSABLE) ×2 IMPLANT
UNDERPAD 30X30 (UNDERPADS AND DIAPERS) ×2 IMPLANT
WATER STERILE IRR 1000ML POUR (IV SOLUTION) ×2 IMPLANT

## 2017-04-07 NOTE — Op Note (Signed)
[name], [name]                ACCOUNT NO.:  [id number]  MEDICAL RECORD NO.:  [id number]  LOCATION:                                 FACILITY:  PHYSICIAN:  Cindee Salt, M.D.            DATE OF BIRTH:  DATE OF PROCEDURE:  04/07/2017 DATE OF DISCHARGE:                              OPERATIVE REPORT   PREOPERATIVE DIAGNOSIS:  Carpometacarpal arthritis, right thumb.  POSTOPERATIVE DIAGNOSIS:  Carpometacarpal arthritis, right thumb.  OPERATION:  Excision of trapezium with abductor pollicis longus transfer of right hand, also known as suspension plasty.  SURGEON:  Cindee Salt, MD.  ASSISTANT:  Betha Loa, MD.  ANESTHESIA:  Supraclavicular block with IV sedation.  PLACE OF SURGERY:  [facility].  ANESTHESIOLOGIST:  [place].  HISTORY:  The patient is a 56 year old male with a history of bilateral CMC arthritis.  He has undergone suspension plasty on his left side.  He is admitted now for suspension plasty, left.  Pre, peri, and postoperative course have been discussed along with risks and complications.  He is aware that there is no guarantee to the surgery; the possibility of infection; recurrence of injury to arteries, nerves, tendons; incomplete relief of symptoms; and dystrophy.  In the preoperative area, the patient was seen, the extremity marked by both patient and surgeon.  Antibiotic given.  PROCEDURE IN DETAIL:  The patient was brought to the operating room, where an IV anesthetic was carried out without difficulty after a supraclavicular block was carried out in the preoperative area.  He was prepped and draped in a supine position with the right arm free.  A 3- minute dry time was allowed and time-out taken, confirming the patient and procedure.  The limb was exsanguinated with an Esmarch bandage. Tourniquet placed high and the arm was inflated to 250 mmHg.  A hockey- stick incision was then made over the base of the thumb metacarpal, carried down along the  first dorsal extensor compartment, carried down through subcutaneous tissue.  Bleeders were electrocauterized with bipolar.  The radial nerve was identified and protected.  The dissection carried down between the extensor pollicis brevis and abductor pollicis longus tendon.  The [facility] joint was identified.  This was opened with sharp dissection.  The radial artery was identified proximally at the STT joint.  The STT joint was identified, an incision was then made between the STT joint and the [facility] joint of the thumb elevating the periosteum up off from the trapezium.  Trapezium was isolated. Retractors were placed.  This was then removed in a piecemeal way using a large rongeur.  This allowed complete removal of the trapezium.  The area of arthritis was immediately apparent on the base of the metacarpal.  The dorsal one-third of the abductor pollicis longus tendon was then separated off.  This was identified proximally with traction. A separate incision was then made at the musculotendinous junction.  The dissection carried down protecting neurovascular structures identifying the musculotendinous junction.  A Carroll tendon retriever was then passed through the fascia, the first dorsal extensor compartment.  A monofilament wire was then used as a cheese cutter to  dissect off the portion of the abductor pollicis longus dorsally.  This transected at the musculotendinous junction and delivered distally.  This wound was irrigated and closed with interrupted 4-0 nylon sutures.  A 7-6/4th drill was then used to put a drill hole on the base of the thumb metacarpal and dorsal to the palmar direction.  A second drill hole was placed in the base of the index metacarpal from the volar to dorsal, radial to ulnar direction.  A separate incision was made dorsally in the interspace between the second and third metacarpal allowing visualization of the hole, which was just made, this was enlarged to 1/8th.   A monofilament wire was then passed through the thumb metacarpal hole in a volar to dorsal direction and the abductor pollicis longus tendon, which was harvested was left attached to the base of the thumb metacarpal and passed through this hole.  A second monofilament wire was then passed through the index metacarpal hole, which had been drilled and enlarged to 1/8th in a volar to dorsal direction.  A fine hemostat was then used to subperiosteally dissect around the base of the index metacarpal through the opening in the skin where the abductor pollicis longus egressed from the base of the second metacarpal.  The tendon was then brought around the base of the index metacarpal passed underneath the abductor pollicis longus tendon graft as it progressed from the thumb to the index and sutured into position with multiple figure-of- eight 4-0 FiberWire sutures.  This firmly suspended the thumb from the index finger.  Full passive range of motion was noted.  The incisions on the dorsal aspect of the space between the index and middle metacarpal was closed with interrupted 4-0 nylon sutures.  The wound from the trapezium was irrigated.  The capsule was closed with figure-of-eight 4- 0 Vicryl sutures.  X-rays confirmed adequate suspension of the thumb from the index finger with no proximal migration with pressure.  The subcutaneous tissue was closed with interrupted 4-0 Vicryl and the skin with interrupted 4-0 nylon sutures.  A sterile compressive dressing and dorsal palmar thumb spica splint were applied.  On deflation of the tourniquet, all fingers immediately pinked.  He was taken to the recovery room for observation in satisfactory condition.  He will be discharged to home to [facility] [place] [facility], on Percocet.          ______________________________ Cindee SaltGary Anastasya Jewell, M.D.     GK/MEDQ  D:  04/07/2017  T:  04/07/2017  Job:  [id number]

## 2017-04-07 NOTE — Brief Op Note (Signed)
04/07/2017  11:41 AM  PATIENT:  [name]  56 y.o. male  PRE-OPERATIVE DIAGNOSIS:  CARPOMETACARPAL ARTHRITIS RIGHT THUMB  POST-OPERATIVE DIAGNOSIS:  CARPOMETACARPAL ARTHRITIS RIGHT THUMB  PROCEDURE:  Procedure(s): CARPOMETACARPEL (CMC) SUSPENSION PLASTY (Right) TRAPEZIUM EXCISION ABDUCTER POLLICIS LONGUS (Right)  SURGEON:  Surgeon(s) and Role:    * Cindee SaltKuzma, Geroldine Esquivias, MD - Primary    * Betha LoaKuzma, Kevin, MD - Assisting  PHYSICIAN ASSISTANT:   ASSISTANTS: K Ashleigh Luckow,MD   ANESTHESIA:   regional and IV sedation  EBL:  1 mL   BLOOD ADMINISTERED:none  DRAINS: none   LOCAL MEDICATIONS USED:  NONE  SPECIMEN:  No Specimen  DISPOSITION OF SPECIMEN:  N/A  COUNTS:  YES  TOURNIQUET:   Total Tourniquet Time Documented: Upper Arm (Right) - 61 minutes Total: Upper Arm (Right) - 61 minutes   DICTATION: .Other Dictation: Dictation Number ([id number]  PLAN OF CARE: Discharge to home after PACU  PATIENT DISPOSITION:  PACU - hemodynamically stable.

## 2017-04-07 NOTE — Anesthesia Procedure Notes (Signed)
Anesthesia Regional Block: Axillary brachial plexus block   Pre-Anesthetic Checklist: ,, timeout performed, Correct Patient, Correct Site, Correct Laterality, Correct Procedure, Correct Position, site marked, Risks and benefits discussed,  Surgical consent,  Pre-op evaluation,  At surgeon's request and post-op pain management  Laterality: Right  Prep: chloraprep       Needles:  Injection technique: Single-shot  Needle Type: Echogenic Stimulator Needle     Needle Length: 5cm  Needle Gauge: 22     Additional Needles:   Procedures:, nerve stimulator,,, ultrasound used (permanent image in chart),,,,  Narrative:  Start time: 04/07/2017 9:08 AM End time: 04/07/2017 9:13 AM Injection made incrementally with aspirations every 5 mL.  Performed by: Personally  Anesthesiologist: Aleigh Grunden  Additional Notes: Functioning IV was confirmed and monitors were applied.  A 50mm 22ga Arrow echogenic stimulator needle was used. Sterile prep and drape,hand hygiene and sterile gloves were used. Ultrasound guidance: relevant anatomy identified, needle position confirmed, local anesthetic spread visualized around nerve(s)., vascular puncture avoided.  Image printed for medical record. Negative aspiration and negative test dose prior to incremental administration of local anesthetic. The patient tolerated the procedure well.

## 2017-04-07 NOTE — Progress Notes (Signed)
Since arrival to PACU, pt SBP has remained above 100mmHg. Ologist was notified and orders for Hydralazine 10mg  IV now given. Will administer as ordered and continue to monitor.

## 2017-04-07 NOTE — Op Note (Signed)
[id number] 

## 2017-04-07 NOTE — Op Note (Signed)
I assisted Surgeon(s) and Role:    * Cindee SaltKuzma, Gary, MD - Primary    * Betha LoaKuzma, Mclean Moya, MD - Assisting on the Procedure(s): CARPOMETACARPEL [facility](CMC) SUSPENSION PLASTY TRAPEZIUM EXCISION ABDUCTER POLLICIS LONGUS on 04/07/2017.  I provided assistance on this case as follows: retraction soft tissues, release for tendon transfer, passing of transferred tendon.  Electronically signed by: Tami RibasKUZMA,Ladona Rosten R, MD Date: 04/07/2017 Time: 2:32 PM

## 2017-04-07 NOTE — H&P (Signed)
[name] is an 56 y.o. male.   Chief Complaint:pain right thumb HPI: [name] is a 56 yo male with  CMC arthritis right thumb. He was given an injection on 12/27/2016 . He states it is given minimal relief of symptoms. His pain is now sharp with any use of his thumb with a VAS score 10/10 on a relatively constant basis. He has had a suspension plasty done on his left side has no complaints without pain or discomfort with that side. He has a history of diabetes thyroid problems arthritis he has no history of gout. His family history is positive for arthritis negative for diabetes thyroid problems and gout. He complains of no numbness or tingling.      Past Medical History:  Diagnosis Date  . Constipation   . CVA (cerebral vascular accident) (HCC) 2005  . H/O dizziness   . Hypertension   . Hypothyroidism   . Pre-diabetes    had been on januvia, no lomger taking  . Pre-syncope   . Seasonal allergies   . Sleep apnea    [facility]    Past Surgical History:  Procedure Laterality Date  . CARPOMETACARPEL SUSPENSION PLASTY Left 04/22/2016   Procedure: LEFT SUSPENSION PLASTY LEFT/ABDUCTOR POLLICIS LONGUS TRANSFER INJECT RIGHT THUMB CARPOMETACARPEL [facility]) JOINT;  Surgeon: Cindee SaltGary Xylia Scherger, MD;  Location: [facility];  Service: Orthopedics;  Laterality: Left;  . EXCISION METACARPAL MASS Left 04/22/2016   Procedure: TRAPEZIUM EXCISION;  Surgeon: Cindee SaltGary Avrey Hyser, MD;  Location: [facility];  Service: Orthopedics;  Laterality: Left;  . NASAL SINUS SURGERY    . OTHER SURGICAL HISTORY     tonsilectomy age 56  . ruptured disc     x 4  . STERIOD INJECTION Right 04/22/2016   Procedure: STEROID INJECTION;  Surgeon: Cindee SaltGary Jasime Westergren, MD;  Location: [facility];  Service: Orthopedics;  Laterality: Right;    Family History  Problem Relation Age of Onset  . Hypertension Brother   . Hypertension Brother    Social History:  reports that he has quit smoking.  He has never used smokeless tobacco. He reports that he does not drink alcohol or use drugs.  Allergies:  Allergies  Allergen Reactions  . Lisinopril     cough  . Naprosyn [Naproxen]     itching    No prescriptions prior to admission.    No results found for this or any previous visit (from the past 48 hour(s)).  No results found.   Pertinent items are noted in HPI.  Height 5' 7.5" (1.715 m), weight 88.9 kg (196 lb).  General appearance: alert, cooperative and appears stated age Head: Normocephalic, without obvious abnormality Neck: no JVD Resp: clear to auscultation bilaterally Cardio: regular rate and rhythm, S1, S2 normal, no murmur, click, rub or gallop GI: soft, non-tender; bowel sounds normal; no masses,  no organomegaly Extremities: :pain right thumb Pulses: 2+ and symmetric Skin: Skin color, texture, turgor normal. No rashes or lesions Neurologic: Grossly normal Incision/Wound: na  Assessment/Plan Assessment:  1. Congenital anomaly of thumb  2. Primary osteoarthritis of first carpometacarpal joint of right hand    Plan: He has failed conservative treatment. He would like to proceed with suspension plasty on his right side which has been done on his left side. Pre-peri-and postoperative course are discussed along with risks and complications. He is where there is no guarantee to the surgery the possibility of infection recurrence injury to arteries nerves tendons complete  relief symptoms dystrophy. He is scheduled for suspension plasty and trapezium excision with APL transfer right thumb as an outpatient under regional anesthesia      [name] 04/07/2017, 3:39 AM

## 2017-04-07 NOTE — Transfer of Care (Signed)
Immediate Anesthesia Transfer of Care Note  Patient: Norton PastelGrady N Winger  Procedure(s) Performed: CARPOMETACARPEL (CMC) SUSPENSION PLASTY (Right Thumb) TRAPEZIUM EXCISION ABDUCTER POLLICIS LONGUS (Right Thumb)  Patient Location: PACU  Anesthesia Type:GA combined with regional for post-op pain  Level of Consciousness: sedated  Airway & Oxygen Therapy: Patient Spontanous Breathing and Patient connected to face mask oxygen  Post-op Assessment: Report given to RN and Post -op Vital signs reviewed and stable  Post vital signs: Reviewed and stable  Last Vitals:  Vitals:   04/07/17 0903 04/07/17 0904  BP: (!) 138/98   Pulse: 67 68  Resp: 18 15  Temp:    SpO2: 100% 100%    Last Pain:  Vitals:   04/07/17 0829  TempSrc: Oral         Complications: No apparent anesthesia complications

## 2017-04-07 NOTE — Discharge Instructions (Signed)

## 2017-04-07 NOTE — Anesthesia Postprocedure Evaluation (Signed)
Anesthesia Post Note  Patient: [name]  Procedure(s) Performed: CARPOMETACARPEL (CMC) SUSPENSION PLASTY (Right Thumb) TRAPEZIUM EXCISION ABDUCTER POLLICIS LONGUS (Right Thumb)     Patient location during evaluation: PACU Anesthesia Type: General Level of consciousness: awake and alert Pain management: pain level controlled Vital Signs Assessment: post-procedure vital signs reviewed and stable Respiratory status: spontaneous breathing, nonlabored ventilation, respiratory function stable and patient connected to nasal cannula oxygen Cardiovascular status: blood pressure returned to baseline and stable Postop Assessment: no apparent nausea or vomiting Anesthetic complications: no    Last Vitals:  Vitals:   04/07/17 1144 04/07/17 1145  BP:  (!) 157/104  Pulse: (!) 59 (!) 58  Resp: 14 12  Temp:    SpO2: 100% 100%    Last Pain:  Vitals:   04/07/17 1141  TempSrc:   PainSc: 0-No pain                 [name]

## 2017-04-07 NOTE — Anesthesia Procedure Notes (Signed)
Procedure Name: LMA Insertion Date/Time: 04/07/2017 10:20 AM Performed by: Genevieve NorlanderLINKA, Leisel Pinette L Pre-anesthesia Checklist: Patient identified, Emergency Drugs available, Suction available, Patient being monitored and Timeout performed Patient Re-evaluated:Patient Re-evaluated prior to induction Oxygen Delivery Method: Circle system utilized Preoxygenation: Pre-oxygenation with 100% oxygen Induction Type: IV induction Ventilation: Mask ventilation without difficulty LMA: LMA inserted LMA Size: 5.0 Number of attempts: 1 Airway Equipment and Method: Bite block Placement Confirmation: positive ETCO2 Tube secured with: Tape Dental Injury: Teeth and Oropharynx as per pre-operative assessment

## 2017-04-07 NOTE — Progress Notes (Signed)
Assisted Dr. Oddono with right, ultrasound guided, axillary block. Side rails up, monitors on throughout procedure. See vital signs in flow sheet. Tolerated Procedure well. 

## 2017-04-08 ENCOUNTER — Encounter (HOSPITAL_BASED_OUTPATIENT_CLINIC_OR_DEPARTMENT_OTHER): Payer: Self-pay | Admitting: Orthopedic Surgery

## 2017-09-06 ENCOUNTER — Ambulatory Visit (INDEPENDENT_AMBULATORY_CARE_PROVIDER_SITE_OTHER): Payer: Self-pay

## 2017-09-06 ENCOUNTER — Encounter (INDEPENDENT_AMBULATORY_CARE_PROVIDER_SITE_OTHER): Payer: Self-pay | Admitting: Orthopedic Surgery

## 2017-09-06 ENCOUNTER — Ambulatory Visit (INDEPENDENT_AMBULATORY_CARE_PROVIDER_SITE_OTHER): Payer: Self-pay | Admitting: Orthopedic Surgery

## 2017-09-06 VITALS — Ht 67.5 in | Wt 201.0 lb

## 2017-09-06 DIAGNOSIS — M79672 Pain in left foot: Secondary | ICD-10-CM

## 2017-09-06 DIAGNOSIS — M79671 Pain in right foot: Secondary | ICD-10-CM

## 2017-09-06 DIAGNOSIS — M6702 Short Achilles tendon (acquired), left ankle: Secondary | ICD-10-CM

## 2017-09-06 DIAGNOSIS — I87323 Chronic venous hypertension (idiopathic) with inflammation of bilateral lower extremity: Secondary | ICD-10-CM | POA: Insufficient documentation

## 2017-09-06 DIAGNOSIS — M6701 Short Achilles tendon (acquired), right ankle: Secondary | ICD-10-CM

## 2017-09-06 NOTE — Progress Notes (Signed)
Office Visit Note   Patient: [name]           Date of Birth: Apr 29, 1961           MRN: [id number] Visit Date: 09/06/2017              Requested by: Emmit Pomfret, MD No address on file PCP: Emmit Pomfret, MD  Chief Complaint  Patient presents with  . Right Foot - Pain  . Left Foot - Pain      HPI: Patient is a 57 year old gentleman who was seen complaining of forefoot pain bilaterally.  He complains of clawing of his toes calluses over the forefoot bilaterally he is wearing sandals.  Patient states he has had constant orthotics made in the past but has not been wearing them.  Patient states that he does have a hole in his heart and that he has had several strokes.  Patient states that sometimes he accumulates fluid around the heart and lungs.  He states that when his legs swell he takes hydrochlorothiazide and Lasix.  States he occasionally has some cramping in his legs.  Assessment & Plan: Visit Diagnoses:  1. Pain in right foot   2. Pain in left foot     Plan: Patient was given instructions for heel cord stretching to do 5 times a day a minute at a time recommended knee-high 15-20 mm of compression stockings he is to wear these daily.  Discussed the importance of wearing the stockings and not relying on fluid pills to relieve swelling.  Patient was recommended to wear a stiff soled sneaker such as a [place]oka sneaker to unload the forefoot and provide stable support for the midfoot.  Follow-Up Instructions: Return if symptoms worsen or fail to improve.   Ortho Exam  Patient is alert, oriented, no adenopathy, well-dressed, normal affect, normal respiratory effort. Examination patient has brawny skin color changes in both legs with pitting edema up to the tibial tubercle with venous insufficiency.  There are no venous ulcers but brawny skin color changes.  Patient has a strong dorsalis pedis and posterior tibial pulse bilaterally.  Patient has significant heel  cord contracture with dorsiflexion 20 degrees short of neutral bilaterally with his knee extended.  Patient does have fixed clawing of his toes with some callus over the forefoot secondary to his Achilles contracture.  Imaging: Xr Foot Complete Left  Result Date: 09/06/2017 2 view radiographs of the left foot shows a long second and third metatarsal no joint space narrowing no angular deformities no stress fractures.  Xr Foot Complete Right  Result Date: 09/06/2017 2 view radiographs of the right foot shows a long second and third metatarsal no angular deformities no stress fractures.  No images are attached to the encounter.  Labs: No results found for: HGBA1C, ESRSEDRATE, CRP, LABURIC, REPTSTATUS, GRAMSTAIN, CULT, LABORGA  @LABSALLVALUES (HGBA1)@  Body mass index is 31.02 kg/m.  Orders:  Orders Placed This Encounter  Procedures  . XR Foot Complete Left  . XR Foot Complete Right   No orders of the defined types were placed in this encounter.    Procedures: No procedures performed  Clinical Data: No additional findings.  ROS:  All other systems negative, except as noted in the HPI. Review of Systems  Objective: Vital Signs: Ht 5' 7.5" (1.715 m)   Wt 201 lb (91.2 kg)   BMI 31.02 kg/m   Specialty Comments:  No specialty comments available.  PMFS History: Patient Active Problem List  Diagnosis Date Noted  . Seasonal allergies   . Constipation   . H/O dizziness   . CVA (cerebral vascular accident) (HCC)   . Pre-syncope    Past Medical History:  Diagnosis Date  . Constipation   . CVA (cerebral vascular accident) (HCC) 2005  . H/O dizziness   . Hypertension   . Hypothyroidism   . Pre-diabetes    had been on januvia, no lomger taking  . Pre-syncope   . Seasonal allergies   . Sleep apnea    [facility]    Family History  Problem Relation Age of Onset  . Hypertension Brother   . Hypertension Brother     Past Surgical History:    Procedure Laterality Date  . CARPOMETACARPEL SUSPENSION PLASTY Left 04/22/2016   Procedure: LEFT SUSPENSION PLASTY LEFT/ABDUCTOR POLLICIS LONGUS TRANSFER INJECT RIGHT THUMB CARPOMETACARPEL [facility]) JOINT;  Surgeon: Cindee SaltGary Kuzma, MD;  Location: [facility];  Service: Orthopedics;  Laterality: Left;  . CARPOMETACARPEL SUSPENSION PLASTY Right 04/07/2017   Procedure: CARPOMETACARPEL [facility](CMC) SUSPENSION PLASTY;  Surgeon: Cindee SaltKuzma, Gary, MD;  Location: [facility];  Service: Orthopedics;  Laterality: Right;  . EXCISION METACARPAL MASS Left 04/22/2016   Procedure: TRAPEZIUM EXCISION;  Surgeon: Cindee SaltGary Kuzma, MD;  Location: [facility];  Service: Orthopedics;  Laterality: Left;  . FINGER ARTHROSCOPY WITH CARPOMETACARPEL [facility]) ARTHROPLASTY Right 04/07/2017   Procedure: TRAPEZIUM EXCISION ABDUCTER POLLICIS LONGUS;  Surgeon: Cindee SaltKuzma, Gary, MD;  Location: [facility];  Service: Orthopedics;  Laterality: Right;  . NASAL SINUS SURGERY    . OTHER SURGICAL HISTORY     tonsilectomy age 57  . ruptured disc     x 4  . STERIOD INJECTION Right 04/22/2016   Procedure: STEROID INJECTION;  Surgeon: Cindee SaltGary Kuzma, MD;  Location: [facility];  Service: Orthopedics;  Laterality: Right;   Social History   Occupational History  . Not on file  Tobacco Use  . Smoking status: Former Games developermoker  . Smokeless tobacco: Never Used  Substance and Sexual Activity  . Alcohol use: No  . Drug use: No  . Sexual activity: Not on file

## 2022-08-02 ENCOUNTER — Ambulatory Visit (INDEPENDENT_AMBULATORY_CARE_PROVIDER_SITE_OTHER): Payer: Medicare Other

## 2022-08-02 ENCOUNTER — Ambulatory Visit (INDEPENDENT_AMBULATORY_CARE_PROVIDER_SITE_OTHER): Payer: Medicare Other | Admitting: Podiatry

## 2022-08-02 ENCOUNTER — Other Ambulatory Visit: Payer: Self-pay | Admitting: Podiatry

## 2022-08-02 DIAGNOSIS — M2041 Other hammer toe(s) (acquired), right foot: Secondary | ICD-10-CM

## 2022-08-02 DIAGNOSIS — M21612 Bunion of left foot: Secondary | ICD-10-CM | POA: Diagnosis not present

## 2022-08-02 DIAGNOSIS — M21611 Bunion of right foot: Secondary | ICD-10-CM

## 2022-08-02 DIAGNOSIS — M2042 Other hammer toe(s) (acquired), left foot: Secondary | ICD-10-CM | POA: Diagnosis not present

## 2022-08-02 DIAGNOSIS — L84 Corns and callosities: Secondary | ICD-10-CM

## 2022-08-02 DIAGNOSIS — M2141 Flat foot [pes planus] (acquired), right foot: Secondary | ICD-10-CM | POA: Diagnosis not present

## 2022-08-02 DIAGNOSIS — M2142 Flat foot [pes planus] (acquired), left foot: Secondary | ICD-10-CM

## 2022-08-02 NOTE — Progress Notes (Signed)
Subjective:  Patient ID: [name], male    DOB: 30-Aug-1960,  MRN: [id number]  Chief Complaint  Patient presents with   Bunions    Pt stated that he has bunions and hammertoes that are causing him a lot of discomfort     62 y.o. male presents with concern for bilateral bunions and hammertoes on both feet.  He says that the second hammertoe on the left foot is causing him more so pain in the right foot.  There is a callus that develops at the end of the second toe related to his hammertoe deformity.  Additionally he says that the bunion causes the pain but he does not have pain with range of motion of the toe joint.  He is hoping to get these corrected as he has tried gel padding toe spacers offloading devices and anti-inflammatory medications and nothing seems to work to decrease his pain.  Past Medical History:  Diagnosis Date   Constipation    CVA (cerebral vascular accident) ([place]) 2005   H/O dizziness    Hypertension    Hypothyroidism    Pre-diabetes    had been on januvia, no lomger taking   Pre-syncope    Seasonal allergies    Sleep apnea    per [facility] at [facility]    Allergies  Allergen Reactions   Lisinopril     cough   Metformin Hcl Diarrhea   Naprosyn [Naproxen]     itching    ROS: Negative except as per HPI above  Objective:  General: AAO x3, NAD  Dermatological: Hyperkeratotic lesion of the distal tuft of the left and right second toes left is worse than the right.  No open ulcerations present.  Vascular:  Dorsalis Pedis artery and Posterior Tibial artery pedal pulses are 2/4 bilateral.  Capillary fill time < 3 sec to all digits.   Neruologic: Grossly intact via light touch bilateral. Protective threshold intact to all sites bilateral.   Musculoskeletal: Bilateral foot with hallux abductovalgus deformity left foot is worse than right.  On the left foot the hallux is abutting the second toe and beginning to drift dorsally to it.  There is significant  hammertoe deformity of the second toe with flexion contracture at the proximal distal interphalangeal joints.  Pain on palpation of the PIPJ as well as the distal tuft of the toe.  Pain on palpation of the medial eminence of the hallux.  First MPJ range of motion is slightly decreased however pain-free.  Decreased arch height bilateral.  Gait: Unassisted, Nonantalgic.   No images are attached to the encounter.  Radiographs:  Date: 08/02/2022 XR both feet Weightbearing AP/Lateral/Oblique   Findings: Left foot: Attention directed to the left foot there is noted to be increased first intermetatarsal angle increased tibial sesamoid position and hallux valgus increased as well.  First MPJ space is slightly diminished on AP view however no significant osteoarthritic changes noted.  Second hammertoe deformity is present with dorsal elevation of the second toe on the lateral view.  Right foot: Attention directed the right foot there is no be increased first metatarsal angle and slightly increased hallux valgus angle.  There is mild arthritic changes noted at the sesamoid bones.  Second hammertoe deformity is present with proximal and distal interphalangeal joint plantarflexion contractures. Assessment:   1. Bunion, left foot   2. Bunion of right foot   3. Hammertoe, bilateral   4. Callus of toe   5. Pes planus of both feet  Plan:  Patient was evaluated and treated and all questions answered.  # Hallux abductovalgus deformity bilateral foot left worse than right as well as second hammertoe deformity bilateral left worse than right with painful hyperkeratotic lesion of the distal tuft of the second toe -Discussed with the patient that he does have clinically symptomatic bunion deformity of both feet however left is much worse than right at this time.  He also has callus at the distal tuft of the second toe bilaterally related to hammertoe deformity. -Discussed conservative or surgical treatment  options with the patient in detail.  Discussed gel toe caps and gel toe spacers however patient has tried these in the been unsuccessful as well as trying different shoes and wider toebox shoes. -Patient is interested in surgical correction. -Discussed that surgical correction in this case would involve bunionectomy either with first MPJ arthrodesis or bunionectomy with distal first metatarsal osteotomy and possible proximal phalanx osteotomy with minimally invasive technique.  Also would recommend second hammertoe repair with proximal phalangeal joint arthrodesis either with implant K wire.  Likely go with implant.  Patient would prefer to maintain motion of the first MPJ and as he does not have significant painful range of motion I would recommend bunionectomy with distal first metatarsal osteotomy possible proximal phalanx osteotomy as well. -Discussed the risk benefits alternatives possible complication of the procedure in detail with the patient discussed the expected postoperative recovery course as well.  Patient would like to proceed informed surgical consent was obtained we will begin surgical planning.  Return for after OR.          Everitt Amber, DPM [facility]

## 2022-08-03 ENCOUNTER — Telehealth: Payer: Self-pay

## 2022-08-03 NOTE — Telephone Encounter (Signed)
Received surgery paperwork from the [facility] office. Left a message for Bartholomew to call and schedule surgery with Dr. Loel Lofty.

## 2023-05-09 NOTE — Progress Notes (Deleted)
VASCULAR AND VEIN SPECIALISTS OF [place]  ASSESSMENT / PLAN: 62 y.o. male with *** - ***  CHIEF COMPLAINT: ***  HISTORY OF PRESENT ILLNESS: [name] is a 62 y.o. male ***  VASCULAR SURGICAL HISTORY: ***  VASCULAR RISK FACTORS: {FINDINGS; POSITIVE NEGATIVE:248-168-1404} history of stroke / transient ischemic attack. {FINDINGS; POSITIVE NEGATIVE:248-168-1404} history of coronary artery disease. *** history of PCI. *** history of CABG.  {FINDINGS; POSITIVE NEGATIVE:248-168-1404} history of diabetes mellitus. Last A1c ***. {FINDINGS; POSITIVE NEGATIVE:248-168-1404} history of smoking. *** actively smoking. {FINDINGS; POSITIVE NEGATIVE:248-168-1404} history of hypertension. *** drug regimen with *** control. {FINDINGS; POSITIVE NEGATIVE:248-168-1404} history of chronic kidney disease.  Last GFR ***. CKD {stage:30421363}. {FINDINGS; POSITIVE NEGATIVE:248-168-1404} history of chronic obstructive pulmonary disease, treated with ***.  FUNCTIONAL STATUS: ECOG performance status: {findings; ecog performance status:31780} Ambulatory status: {TNHAmbulation:25868}  CAREY 1 AND 3 YEAR INDEX Male (2pts) 75-79 or 80-84 (2pts) >84 (3pts) Dependence in toileting (1pt) Partial or full dependence in dressing (1pt) History of malignant neoplasm (2pts) CHF (3pts) COPD (1pts) CKD (3pts)  0-3 pts 6% 1 year mortality ; 21% 3 year mortality 4-5 pts 12% 1 year mortality ; 36% 3 year mortality >5 pts 21% 1 year mortality; 54% 3 year mortality   Past Medical History:  Diagnosis Date   Constipation    CVA (cerebral vascular accident) (HCC) 2005   H/O dizziness    Hypertension    Hypothyroidism    Pre-diabetes    had been on januvia, no lomger taking   Pre-syncope    Seasonal allergies    Sleep apnea    per cardiology notes at [facility]    Past Surgical History:  Procedure Laterality Date   CARPOMETACARPEL SUSPENSION PLASTY Left 04/22/2016   Procedure: LEFT SUSPENSION PLASTY LEFT/ABDUCTOR POLLICIS  LONGUS TRANSFER INJECT RIGHT THUMB CARPOMETACARPEL [facility]) JOINT;  Surgeon: Cindee Salt, MD;  Location: [facility];  Service: Orthopedics;  Laterality: Left;   CARPOMETACARPEL SUSPENSION PLASTY Right 04/07/2017   Procedure: CARPOMETACARPEL [facility]) SUSPENSION PLASTY;  Surgeon: Cindee Salt, MD;  Location: [facility];  Service: Orthopedics;  Laterality: Right;   EXCISION METACARPAL MASS Left 04/22/2016   Procedure: TRAPEZIUM EXCISION;  Surgeon: Cindee Salt, MD;  Location: [facility];  Service: Orthopedics;  Laterality: Left;   FINGER ARTHROSCOPY WITH CARPOMETACARPEL [facility]) ARTHROPLASTY Right 04/07/2017   Procedure: TRAPEZIUM EXCISION ABDUCTER POLLICIS LONGUS;  Surgeon: Cindee Salt, MD;  Location: [facility];  Service: Orthopedics;  Laterality: Right;   NASAL SINUS SURGERY     OTHER SURGICAL HISTORY     tonsilectomy age 68   ruptured disc     x 4   STERIOD INJECTION Right 04/22/2016   Procedure: STEROID INJECTION;  Surgeon: Cindee Salt, MD;  Location:  SURGERY CENTER;  Service: Orthopedics;  Laterality: Right;    Family History  Problem Relation Age of Onset   Hypertension Brother    Hypertension Brother     Social History   Socioeconomic History   Marital status: Single    Spouse name: Not on file   Number of children: Not on file   Years of education: Not on file   Highest education level: Not on file  Occupational History   Not on file  Tobacco Use   Smoking status: Former   Smokeless tobacco: Never  Substance and Sexual Activity   Alcohol use: No   Drug use: No   Sexual activity: Not on file  Other Topics Concern   Not on file  Social History Narrative   Not on file   Social Determinants of Health   Financial Resource Strain: Not on file  Food Insecurity: Not on file  Transportation Needs: Not on file  Physical Activity: Not on file  Stress: Not on file  Social Connections: Not on file  Intimate Partner  Violence: Not on file    Allergies  Allergen Reactions   Lisinopril     cough   Metformin Hcl Diarrhea   Naprosyn [Naproxen]     itching    Current Outpatient Medications  Medication Sig Dispense Refill   acetaminophen-codeine (TYLENOL #3) 300-30 MG tablet Take 1 tablet by mouth every 8 (eight) hours as needed.     aspirin 325 MG tablet Take 325 mg by mouth daily.     beclomethasone (QVAR) 80 MCG/ACT inhaler Inhale 2 puffs into the lungs 2 (two) times daily.     celecoxib (CELEBREX) 100 MG capsule Take 100 mg by mouth 2 (two) times daily.     cholecalciferol (VITAMIN D) 1000 units tablet Take 1,000 Units by mouth daily.     clotrimazole-betamethasone (LOTRISONE) lotion Apply topically.     ergocalciferol, VITAMIN D2, (DRISDOL) 200 MCG/ML drops Take by mouth.     FLUoxetine (PROZAC) 20 MG capsule      fluticasone (FLONASE) 50 MCG/ACT nasal spray Place into both nostrils.     furosemide (LASIX) 20 MG tablet Take 20 mg by mouth.     gabapentin (NEURONTIN) 300 MG capsule Take by mouth.     hydrALAZINE (APRESOLINE) 25 MG tablet Take 25 mg by mouth 4 (four) times daily.     hydrochlorothiazide (MICROZIDE) 12.5 MG capsule Take 12.5 mg by mouth daily.     hydrOXYzine (VISTARIL) 25 MG capsule TAKE 1 CAPSULE BY MOUTH EVERY 8 HOURS FOR 10 DAYS     ketorolac (TORADOL) 10 MG tablet Take by mouth.     labetalol (NORMODYNE) 100 MG tablet Take 100 mg by mouth 2 (two) times daily.     levothyroxine (SYNTHROID, LEVOTHROID) 75 MCG tablet Take 75 mcg by mouth daily before breakfast.     lidocaine (XYLOCAINE) 1 % (with preservative) injection by Infiltration route.     loperamide (IMODIUM A-D) 2 MG tablet Take by mouth.     mometasone (NASONEX) 50 MCG/ACT nasal spray Place 2 sprays into the nose daily.     Olopatadine HCl 0.2 % SOLN      potassium chloride (KLOR-CON) 10 MEQ tablet Take 10 mEq by mouth 2 (two) times daily.     pravastatin (PRAVACHOL) 20 MG tablet Take 1 tablet by mouth daily.      predniSONE (STERAPRED UNI-PAK 21 TAB) 10 MG (21) TBPK tablet Take by mouth.     sildenafil (VIAGRA) 100 MG tablet Take by mouth.     sitaGLIPtin (JANUVIA) 100 MG tablet Take by mouth.     tamsulosin (FLOMAX) 0.4 MG CAPS capsule Take by mouth.     topiramate (TOPAMAX) 100 MG tablet Take 100 mg by mouth daily. Taking 1/2 daily     triamcinolone acetonide (KENALOG-40) 40 MG/ML injection Inject into the articular space.     No current facility-administered medications for this visit.    PHYSICAL EXAM There were no vitals filed for this visit.  Constitutional: *** appearing. *** distress. Appears *** nourished.  Neurologic: CN ***. *** focal findings. *** sensory loss. Psychiatric: *** Mood and affect symmetric and appropriate. Eyes: *** No icterus. No conjunctival pallor. Ears, nose, throat: *** mucous membranes moist.  Midline trachea.  Cardiac: *** rate and rhythm.  Respiratory: *** unlabored. Abdominal: *** soft, non-tender, non-distended.  Peripheral vascular: *** Extremity: *** edema. *** cyanosis. *** pallor.  Skin: *** gangrene. *** ulceration.  Lymphatic: *** Stemmer's sign. *** palpable lymphadenopathy.    PERTINENT LABORATORY AND RADIOLOGIC DATA  Most recent CBC    Latest Ref Rng & Units 04/07/2017    8:42 AM 04/22/2016    7:59 AM  CBC  Hemoglobin 13.0 - 17.0 g/dL 21.3  08.6   Hematocrit 39.0 - 52.0 % 44.0  48.0      Most recent CMP    Latest Ref Rng & Units 04/07/2017    8:42 AM 04/22/2016    7:59 AM  CMP  Glucose 65 - 99 mg/dL 578  469   BUN 6 - 20 mg/dL 17  11   Creatinine 6.29 - 1.24 mg/dL 5.28  4.13   Sodium 244 - 145 mmol/L 143  141   Potassium 3.5 - 5.1 mmol/L 3.3  3.6   Chloride 101 - 111 mmol/L 108  104     Renal function CrCl cannot be calculated (Patient's most recent lab result is older than the maximum 21 days allowed.).  No results found for: "HGBA1C"  No results found for: "LDLCALC", "LDLC", "HIRISKLDL", "POCLDL", "LDLDIRECT", "REALLDLC",  "TOTLDLC"   Vascular Imaging: ***  Chanz Cahall N. Lenell Antu, MD [facility] Vascular and Vein Specialists of [facility] Phone Number: [phone] 05/09/2023 12:27 PM   Total time spent on preparing this encounter including chart review, data review, collecting history, examining the patient, coordinating care for this {tnhtimebilling:26202}  Portions of this report may have been transcribed using voice recognition software.  Every effort has been made to ensure accuracy; however, inadvertent computerized transcription errors may still be present.

## 2023-05-10 ENCOUNTER — Encounter: Payer: Medicare HMO | Admitting: Vascular Surgery

## 2023-05-23 NOTE — Progress Notes (Unsigned)
VASCULAR AND VEIN SPECIALISTS OF [place]  ASSESSMENT / PLAN: 62 y.o. male with 39mm left iliac artery aneurysm on outside imaging report. At this size, repair should be considered. Counseled patient that a CT angiogram of the abdomen pelvis should be done to evaluate his surgical anatomy and assess his candidacy for an endograft care.  He is understanding.  Will get back together after CT scan is done to review her options for surgical repair.  Recommend:  Abstinence from all tobacco products. Blood glucose control with goal A1c < 7%. Blood pressure control with goal blood pressure < 140/90 mmHg. Lipid reduction therapy with goal LDL-C <100 mg/dL  Aspirin 81mg  PO QD.  Atorvastatin 40-80mg  PO QD (or other "high intensity" statin therapy).  CHIEF COMPLAINT: incidental aneurysm  HISTORY OF PRESENT ILLNESS: [name] is a 62 y.o. male referred to clinic for evaluation of incidental discovery of a left iliac artery aneurysm demonstrated on CT scan of the abdomen pelvis done for abdominal pain.  The patient describes a myriad of symptoms, none of which can be attributed to aneurysm.  He reports unsteadiness on his feet.  He reports frequent falls.  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  Counseled the patient about the natural history of aneurysm disease.  Past Medical History:  Diagnosis Date   Constipation    CVA (cerebral vascular accident) (HCC) 2005   H/O dizziness    Hypertension    Hypothyroidism    Pre-diabetes    had been on januvia, no lomger taking   Pre-syncope    Seasonal allergies    Sleep apnea    per cardiology notes at [facility]    Past Surgical History:  Procedure Laterality Date   CARPOMETACARPEL SUSPENSION PLASTY Left 04/22/2016   Procedure: LEFT SUSPENSION PLASTY LEFT/ABDUCTOR POLLICIS LONGUS TRANSFER INJECT RIGHT THUMB CARPOMETACARPEL [facility]) JOINT;  Surgeon: Cindee Salt, MD;  Location: [facility];  Service: Orthopedics;  Laterality: Left;    CARPOMETACARPEL SUSPENSION PLASTY Right 04/07/2017   Procedure: CARPOMETACARPEL [facility]) SUSPENSION PLASTY;  Surgeon: Cindee Salt, MD;  Location: [facility];  Service: Orthopedics;  Laterality: Right;   EXCISION METACARPAL MASS Left 04/22/2016   Procedure: TRAPEZIUM EXCISION;  Surgeon: Cindee Salt, MD;  Location: [facility];  Service: Orthopedics;  Laterality: Left;   FINGER ARTHROSCOPY WITH CARPOMETACARPEL [facility]) ARTHROPLASTY Right 04/07/2017   Procedure: TRAPEZIUM EXCISION ABDUCTER POLLICIS LONGUS;  Surgeon: Cindee Salt, MD;  Location: [facility];  Service: Orthopedics;  Laterality: Right;   NASAL SINUS SURGERY     OTHER SURGICAL HISTORY     tonsilectomy age 20   ruptured disc     x 4   STERIOD INJECTION Right 04/22/2016   Procedure: STEROID INJECTION;  Surgeon: Cindee Salt, MD;  Location:  SURGERY CENTER;  Service: Orthopedics;  Laterality: Right;    Family History  Problem Relation Age of Onset   Hypertension Brother    Hypertension Brother     Social History   Socioeconomic History   Marital status: Single    Spouse name: Not on file   Number of children: Not on file   Years of education: Not on file   Highest education level: Not on file  Occupational History   Not on file  Tobacco Use   Smoking status: Former   Smokeless tobacco: Never  Substance and Sexual Activity   Alcohol use: No   Drug use: No   Sexual activity: Not on file  Other Topics  Concern   Not on file  Social History Narrative   Not on file   Social Determinants of Health   Financial Resource Strain: Not on file  Food Insecurity: Not on file  Transportation Needs: Not on file  Physical Activity: Not on file  Stress: Not on file  Social Connections: Not on file  Intimate Partner Violence: Not on file    Allergies  Allergen Reactions   Lisinopril     cough   Metformin Hcl Diarrhea   Naprosyn [Naproxen]     itching    Current Outpatient  Medications  Medication Sig Dispense Refill   acetaminophen-codeine (TYLENOL #3) 300-30 MG tablet Take 1 tablet by mouth every 8 (eight) hours as needed.     aspirin 325 MG tablet Take 325 mg by mouth daily.     beclomethasone (QVAR) 80 MCG/ACT inhaler Inhale 2 puffs into the lungs 2 (two) times daily.     celecoxib (CELEBREX) 100 MG capsule Take 100 mg by mouth 2 (two) times daily.     cholecalciferol (VITAMIN D) 1000 units tablet Take 1,000 Units by mouth daily.     clotrimazole-betamethasone (LOTRISONE) lotion Apply topically.     ergocalciferol, VITAMIN D2, (DRISDOL) 200 MCG/ML drops Take by mouth.     FLUoxetine (PROZAC) 20 MG capsule      fluticasone (FLONASE) 50 MCG/ACT nasal spray Place into both nostrils.     furosemide (LASIX) 20 MG tablet Take 20 mg by mouth.     gabapentin (NEURONTIN) 300 MG capsule Take by mouth.     hydrALAZINE (APRESOLINE) 25 MG tablet Take 25 mg by mouth 4 (four) times daily.     hydrochlorothiazide (MICROZIDE) 12.5 MG capsule Take 12.5 mg by mouth daily.     hydrOXYzine (VISTARIL) 25 MG capsule TAKE 1 CAPSULE BY MOUTH EVERY 8 HOURS FOR 10 DAYS     ketorolac (TORADOL) 10 MG tablet Take by mouth.     labetalol (NORMODYNE) 100 MG tablet Take 100 mg by mouth 2 (two) times daily.     levothyroxine (SYNTHROID, LEVOTHROID) 75 MCG tablet Take 75 mcg by mouth daily before breakfast.     lidocaine (XYLOCAINE) 1 % (with preservative) injection by Infiltration route.     loperamide (IMODIUM A-D) 2 MG tablet Take by mouth.     mometasone (NASONEX) 50 MCG/ACT nasal spray Place 2 sprays into the nose daily.     Olopatadine HCl 0.2 % SOLN      potassium chloride (KLOR-CON) 10 MEQ tablet Take 10 mEq by mouth 2 (two) times daily.     pravastatin (PRAVACHOL) 20 MG tablet Take 1 tablet by mouth daily.     predniSONE (STERAPRED UNI-PAK 21 TAB) 10 MG (21) TBPK tablet Take by mouth.     sildenafil (VIAGRA) 100 MG tablet Take by mouth.     sitaGLIPtin (JANUVIA) 100 MG tablet Take  by mouth.     tamsulosin (FLOMAX) 0.4 MG CAPS capsule Take by mouth.     topiramate (TOPAMAX) 100 MG tablet Take 100 mg by mouth daily. Taking 1/2 daily     triamcinolone acetonide (KENALOG-40) 40 MG/ML injection Inject into the articular space.     No current facility-administered medications for this visit.    PHYSICAL EXAM Vitals:   05/24/23 1413  BP: 118/78  Pulse: 83  Resp: 20  Temp: 98.3 F (36.8 C)  SpO2: 98%  Weight: 154 lb 14.4 oz (70.3 kg)  Height: 5' 7.5" (1.715 m)    Appears older  than stated age.   Regular rate and rhythm Unlabored breathing Palpable dorsalis pedis and popliteal pulses bilaterally    PERTINENT LABORATORY AND RADIOLOGIC DATA  Most recent CBC    Latest Ref Rng & Units 04/07/2017    8:42 AM 04/22/2016    7:59 AM  CBC  Hemoglobin 13.0 - 17.0 g/dL 41.3  24.4   Hematocrit 39.0 - 52.0 % 44.0  48.0      Most recent CMP    Latest Ref Rng & Units 04/07/2017    8:42 AM 04/22/2016    7:59 AM  CMP  Glucose 65 - 99 mg/dL 010  272   BUN 6 - 20 mg/dL 17  11   Creatinine 5.36 - 1.24 mg/dL 6.44  0.34   Sodium 742 - 145 mmol/L 143  141   Potassium 3.5 - 5.1 mmol/L 3.3  3.6   Chloride 101 - 111 mmol/L 108  104    Outside CT scan report personally reviewed.  Images not available for my review Bilateral iliac artery aneurysms.  Report mentions left iliac artery aneurysm measures 3.9 cm.  The right measures 2.2 cm.  Rande Brunt. Lenell Antu, MD FACS [facility] Special[facility] of [facility] Phone Number: [phone] 05/25/2023 2:43 PM   Total time spent on preparing this encounter including chart review, data review, collecting history, examining the patient, coordinating care for this new patient, 60 minutes.  Portions of this report may have been transcribed using voice recognition software.  Every effort has been made to ensure accuracy; however, inadvertent computerized transcription errors may still be present.

## 2023-05-24 ENCOUNTER — Ambulatory Visit (INDEPENDENT_AMBULATORY_CARE_PROVIDER_SITE_OTHER): Payer: Medicare HMO | Admitting: Vascular Surgery

## 2023-05-24 ENCOUNTER — Encounter: Payer: Self-pay | Admitting: Vascular Surgery

## 2023-05-24 VITALS — BP 118/78 | HR 83 | Temp 98.3°F | Resp 20 | Ht 67.5 in | Wt 154.9 lb

## 2023-05-24 DIAGNOSIS — I723 Aneurysm of iliac artery: Secondary | ICD-10-CM | POA: Diagnosis not present

## 2023-05-25 ENCOUNTER — Other Ambulatory Visit: Payer: Self-pay

## 2023-05-25 DIAGNOSIS — I723 Aneurysm of iliac artery: Secondary | ICD-10-CM

## 2023-06-13 NOTE — Progress Notes (Deleted)
 VASCULAR AND VEIN SPECIALISTS OF [place]  ASSESSMENT / PLAN: 62 y.o. male with 39mm left iliac artery aneurysm on outside imaging report. At this size, repair should be considered. Counseled patient that a CT angiogram of the abdomen pelvis should be done to evaluate his surgical anatomy and assess his candidacy for an endograft care.  He is understanding.  Will get back together after CT scan is done to review her options for surgical repair.  Recommend:  Abstinence from all tobacco products. Blood glucose control with goal A1c < 7%. Blood pressure control with goal blood pressure < 140/90 mmHg. Lipid reduction therapy with goal LDL-C <100 mg/dL  Aspirin 81mg  PO QD.  Atorvastatin 40-80mg  PO QD (or other high intensity statin therapy).  CHIEF COMPLAINT: incidental aneurysm  HISTORY OF PRESENT ILLNESS: [name] is a 62 y.o. male referred to clinic for evaluation of incidental discovery of a left iliac artery aneurysm demonstrated on CT scan of the abdomen pelvis done for abdominal pain.  The patient describes a myriad of symptoms, none of which can be attributed to aneurysm.  He reports unsteadiness on his feet.  He reports frequent falls.  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  Counseled the patient about the natural history of aneurysm disease.  Past Medical History:  Diagnosis Date   Constipation    CVA (cerebral vascular accident) (HCC) 2005   H/O dizziness    Hypertension    Hypothyroidism    Pre-diabetes    had been on januvia, no lomger taking   Pre-syncope    Seasonal allergies    Sleep apnea    per [facility] at [facility]    Past Surgical History:  Procedure Laterality Date   CARPOMETACARPEL SUSPENSION PLASTY Left 04/22/2016   Procedure: LEFT SUSPENSION PLASTY LEFT/ABDUCTOR POLLICIS LONGUS TRANSFER INJECT RIGHT THUMB CARPOMETACARPEL [facility]) JOINT;  Surgeon: Arley Curia, MD;  Location: [facility];  Service: Orthopedics;  Laterality: Left;    CARPOMETACARPEL SUSPENSION PLASTY Right 04/07/2017   Procedure: CARPOMETACARPEL [facility]) SUSPENSION PLASTY;  Surgeon: Curia Arley, MD;  Location: [facility];  Service: Orthopedics;  Laterality: Right;   EXCISION METACARPAL MASS Left 04/22/2016   Procedure: TRAPEZIUM EXCISION;  Surgeon: Arley Curia, MD;  Location: [facility];  Service: Orthopedics;  Laterality: Left;   FINGER ARTHROSCOPY WITH CARPOMETACARPEL [facility]) ARTHROPLASTY Right 04/07/2017   Procedure: TRAPEZIUM EXCISION ABDUCTER POLLICIS LONGUS;  Surgeon: Curia Arley, MD;  Location: [facility];  Service: Orthopedics;  Laterality: Right;   NASAL SINUS SURGERY     OTHER SURGICAL HISTORY     tonsilectomy age 57   ruptured disc     x 4   STERIOD INJECTION Right 04/22/2016   Procedure: STEROID INJECTION;  Surgeon: Arley Curia, MD;  Location:  SURGERY CENTER;  Service: Orthopedics;  Laterality: Right;    Family History  Problem Relation Age of Onset   Hypertension Brother    Hypertension Brother     Social History   Socioeconomic History   Marital status: Single    Spouse name: Not on file   Number of children: Not on file   Years of education: Not on file   Highest education level: Not on file  Occupational History   Not on file  Tobacco Use   Smoking status: Former   Smokeless tobacco: Never  Substance and Sexual Activity   Alcohol use: No   Drug use: No   Sexual activity: Not on file  Other Topics  Concern   Not on file  Social History Narrative   Not on file   Social Drivers of Health   Financial Resource Strain: Not on file  Food Insecurity: Not on file  Transportation Needs: Not on file  Physical Activity: Not on file  Stress: Not on file  Social Connections: Not on file  Intimate Partner Violence: Not on file    Allergies  Allergen Reactions   Lisinopril     cough   Metformin Hcl Diarrhea   Naprosyn [Naproxen]     itching    Current Outpatient  Medications  Medication Sig Dispense Refill   acetaminophen -codeine (TYLENOL  #3) 300-30 MG tablet Take 1 tablet by mouth every 8 (eight) hours as needed.     aspirin 325 MG tablet Take 325 mg by mouth daily.     beclomethasone (QVAR) 80 MCG/ACT inhaler Inhale 2 puffs into the lungs 2 (two) times daily.     celecoxib (CELEBREX) 100 MG capsule Take 100 mg by mouth 2 (two) times daily.     cholecalciferol (VITAMIN D) 1000 units tablet Take 1,000 Units by mouth daily.     clotrimazole-betamethasone  (LOTRISONE) lotion Apply topically.     ergocalciferol, VITAMIN D2, (DRISDOL) 200 MCG/ML drops Take by mouth.     FLUoxetine (PROZAC) 20 MG capsule      fluticasone (FLONASE) 50 MCG/ACT nasal spray Place into both nostrils.     furosemide (LASIX) 20 MG tablet Take 20 mg by mouth.     gabapentin (NEURONTIN) 300 MG capsule Take by mouth.     hydrALAZINE  (APRESOLINE ) 25 MG tablet Take 25 mg by mouth 4 (four) times daily.     hydrochlorothiazide (MICROZIDE) 12.5 MG capsule Take 12.5 mg by mouth daily.     hydrOXYzine (VISTARIL) 25 MG capsule TAKE 1 CAPSULE BY MOUTH EVERY 8 HOURS FOR 10 DAYS     ketorolac (TORADOL) 10 MG tablet Take by mouth.     labetalol (NORMODYNE) 100 MG tablet Take 100 mg by mouth 2 (two) times daily.     levothyroxine (SYNTHROID, LEVOTHROID) 75 MCG tablet Take 75 mcg by mouth daily before breakfast.     lidocaine  (XYLOCAINE ) 1 % (with preservative) injection by Infiltration route.     loperamide (IMODIUM A-D) 2 MG tablet Take by mouth.     mometasone (NASONEX) 50 MCG/ACT nasal spray Place 2 sprays into the nose daily.     Olopatadine HCl 0.2 % SOLN      potassium chloride (KLOR-CON) 10 MEQ tablet Take 10 mEq by mouth 2 (two) times daily.     pravastatin (PRAVACHOL) 20 MG tablet Take 1 tablet by mouth daily.     predniSONE (STERAPRED UNI-PAK 21 TAB) 10 MG (21) TBPK tablet Take by mouth.     sildenafil (VIAGRA) 100 MG tablet Take by mouth.     sitaGLIPtin (JANUVIA) 100 MG tablet Take  by mouth.     tamsulosin (FLOMAX) 0.4 MG CAPS capsule Take by mouth.     topiramate (TOPAMAX) 100 MG tablet Take 100 mg by mouth daily. Taking 1/2 daily     triamcinolone acetonide (KENALOG-40) 40 MG/ML injection Inject into the articular space.     No current facility-administered medications for this visit.    PHYSICAL EXAM There were no vitals filed for this visit.   Appears older than stated age.   Regular rate and rhythm Unlabored breathing Palpable dorsalis pedis and popliteal pulses bilaterally    PERTINENT LABORATORY AND RADIOLOGIC DATA  Most recent CBC  Latest Ref Rng & Units 04/07/2017    8:42 AM 04/22/2016    7:59 AM  CBC  Hemoglobin 13.0 - 17.0 g/dL 84.9  83.6   Hematocrit 39.0 - 52.0 % 44.0  48.0      Most recent CMP    Latest Ref Rng & Units 04/07/2017    8:42 AM 04/22/2016    7:59 AM  CMP  Glucose 65 - 99 mg/dL 895  886   BUN 6 - 20 mg/dL 17  11   Creatinine 9.38 - 1.24 mg/dL 9.19  9.29   Sodium 864 - 145 mmol/L 143  141   Potassium 3.5 - 5.1 mmol/L 3.3  3.6   Chloride 101 - 111 mmol/L 108  104    Outside CT scan report personally reviewed.  Images not available for my review Bilateral iliac artery aneurysms.  Report mentions left iliac artery aneurysm measures 3.9 cm.  The right measures 2.2 cm.  Debby SAILOR. Magda, MD FACS [facility] of [facility] Phone Number: [phone] 06/13/2023 2:43 PM   Total time spent on preparing this encounter including chart review, data review, collecting history, examining the patient, coordinating care for this new patient, 60 minutes.  Portions of this report may have been transcribed using voice recognition software.  Every effort has been made to ensure accuracy; however, inadvertent computerized transcription errors may still be present.

## 2023-06-14 ENCOUNTER — Ambulatory Visit: Payer: Medicare Other | Admitting: Vascular Surgery

## 2023-09-20 ENCOUNTER — Ambulatory Visit: Admitting: Podiatry

## 2023-10-04 ENCOUNTER — Ambulatory Visit: Admitting: Podiatry

## 2024-02-27 DIAGNOSIS — F419 Anxiety disorder, unspecified: Secondary | ICD-10-CM | POA: Diagnosis not present

## 2024-03-07 DIAGNOSIS — E559 Vitamin D deficiency, unspecified: Secondary | ICD-10-CM | POA: Diagnosis not present

## 2024-03-07 DIAGNOSIS — E038 Other specified hypothyroidism: Secondary | ICD-10-CM | POA: Diagnosis not present

## 2024-03-07 DIAGNOSIS — M158 Other polyosteoarthritis: Secondary | ICD-10-CM | POA: Diagnosis not present

## 2024-03-07 DIAGNOSIS — G43019 Migraine without aura, intractable, without status migrainosus: Secondary | ICD-10-CM | POA: Diagnosis not present

## 2024-03-07 DIAGNOSIS — I10 Essential (primary) hypertension: Secondary | ICD-10-CM | POA: Diagnosis not present

## 2024-03-07 DIAGNOSIS — F4310 Post-traumatic stress disorder, unspecified: Secondary | ICD-10-CM | POA: Diagnosis not present

## 2024-03-07 DIAGNOSIS — N401 Enlarged prostate with lower urinary tract symptoms: Secondary | ICD-10-CM | POA: Diagnosis not present

## 2024-03-07 DIAGNOSIS — I700 Atherosclerosis of aorta: Secondary | ICD-10-CM | POA: Diagnosis not present

## 2024-03-07 DIAGNOSIS — E119 Type 2 diabetes mellitus without complications: Secondary | ICD-10-CM | POA: Diagnosis not present

## 2024-03-07 DIAGNOSIS — F419 Anxiety disorder, unspecified: Secondary | ICD-10-CM | POA: Diagnosis not present

## 2024-03-07 DIAGNOSIS — E782 Mixed hyperlipidemia: Secondary | ICD-10-CM | POA: Diagnosis not present

## 2024-03-07 DIAGNOSIS — D518 Other vitamin B12 deficiency anemias: Secondary | ICD-10-CM | POA: Diagnosis not present

## 2024-03-12 DIAGNOSIS — I10 Essential (primary) hypertension: Secondary | ICD-10-CM | POA: Diagnosis not present
